# Patient Record
Sex: Male | Born: 1990 | Race: White | Hispanic: No | Marital: Single | Smoking: Current every day smoker
Health system: Southern US, Academic
[De-identification: ages and names within clinical notes are randomized; demographics above are authoritative.]

---

## 2016-06-12 ENCOUNTER — Emergency Department
Admission: EM | Admit: 2016-06-12 | Discharge: 2016-06-12 | Disposition: A | Discharge status: Home / Self Care | Payer: Self-pay | Attending: Emergency Medicine | Admitting: Emergency Medicine

## 2016-06-12 ENCOUNTER — Encounter: Payer: Self-pay | Admitting: Emergency Medicine

## 2016-06-12 ENCOUNTER — Emergency Department: Payer: Self-pay

## 2016-06-12 DIAGNOSIS — J301 Allergic rhinitis due to pollen: Secondary | ICD-10-CM | POA: Insufficient documentation

## 2016-06-12 DIAGNOSIS — J069 Acute upper respiratory infection, unspecified: Secondary | ICD-10-CM | POA: Insufficient documentation

## 2016-06-12 DIAGNOSIS — F17200 Nicotine dependence, unspecified, uncomplicated: Secondary | ICD-10-CM | POA: Insufficient documentation

## 2016-06-12 DIAGNOSIS — B9789 Other viral agents as the cause of diseases classified elsewhere: Secondary | ICD-10-CM

## 2016-06-12 MED ORDER — DEXAMETHASONE SODIUM PHOSPHATE 10 MG/ML IJ SOLN
10.0000 mg | Freq: Once | INTRAMUSCULAR | Status: AC
  Administered 2016-06-12: 10 mg via INTRAMUSCULAR
  Filled 2016-06-12: qty 1

## 2016-06-12 MED ORDER — PREDNISONE 10 MG PO TABS: 10.0000 mg | ORAL_TABLET | Freq: Two times a day (BID) | ORAL | 0 refills | Status: DC

## 2016-06-12 MED ORDER — FLUTICASONE PROPIONATE 50 MCG/ACT NA SUSP: 2.0000 | Freq: Every day | NASAL | 0 refills | Status: DC

## 2016-06-12 MED ORDER — BENZONATATE 100 MG PO CAPS: 100.0000 mg | ORAL_CAPSULE | Freq: Three times a day (TID) | ORAL | 0 refills | Status: DC | PRN

## 2016-06-12 MED ORDER — IPRATROPIUM-ALBUTEROL 0.5-2.5 (3) MG/3ML IN SOLN
3.0000 mL | Freq: Once | RESPIRATORY_TRACT | Status: AC
  Administered 2016-06-12: 3 mL via RESPIRATORY_TRACT
  Filled 2016-06-12: qty 3

## 2016-06-12 NOTE — ED Triage Notes (Signed)
Pt reports for 3 weeks been coughing productive cough, sputum green in color, reports today sinus pressure radiating to left ear. Pt talks in complete sentences no respiratory distress noted.

## 2016-06-12 NOTE — Discharge Instructions (Signed)
Your symptoms are likely viral in nature. Your chest x-ray was negative for any signs of pneumonia or bronchitis. You will, however, be treated for a viral bronchitis. Take the prescription meds, along with OTC Benadryl or Claritin/Zyrtec for nasal drainage. Consider starting pseudoephedrine, which is a decongestant. It will decrease your facial and ear pressure. Drink plenty of fluids and rest as much as possible.

## 2016-06-12 NOTE — ED Provider Notes (Signed)
[facility] Emergency Department Provider Note ____________________________________________  Time seen: 2043  I have reviewed the triage vital signs and the nursing notes.  HISTORY  Chief Complaint  Facial Pain and Cough  HPI Tim Collins is a 26 y.o. male presents to the ED for evaluation of a 3 to four-week complaint of intermittent cough, congestion, postnasal drainage, and ear pressure. Patient reports subjective fevers,left ear pressure and fullness, and bodyaches. He is dosed over-the-counter cough syrup, Chloraseptic, and Tylenol with limited benefit. He denies any recent travel or exposures. He does report similar symptoms in his coworkers, he works at [employer]. He is a smoker but denies any history of asthma, bronchitis, or seasonal allergies.  History reviewed. No pertinent past medical history.  There are no active problems to display for this patient.  History reviewed. No pertinent surgical history.  Prior to Admission medications   Medication Sig Start Date End Date Taking? Authorizing Provider  benzonatate (TESSALON PERLES) 100 MG capsule Take 1 capsule (100 mg total) by mouth 3 (three) times daily as needed for cough (Take 1-2 per dose). 06/12/16   Hyun Marsalis, Charlesetta IvoryJenise V Bacon, PA-C  fluticasone (FLONASE) 50 MCG/ACT nasal spray Place 2 sprays into both nostrils daily. 06/12/16   Delainy Mcelhiney, Charlesetta IvoryJenise V Bacon, PA-C  predniSONE (DELTASONE) 10 MG tablet Take 1 tablet (10 mg total) by mouth 2 (two) times daily with a meal. 06/12/16   Robin Pafford, Charlesetta IvoryJenise V Bacon, PA-C    Allergies Patient has no known allergies.  No family history on file.  Social History Social History  Substance Use Topics  . Smoking status: Current Every Day Smoker  . Smokeless tobacco: Current User  . Alcohol use No    Review of Systems  Constitutional: Positive for subjective fevers. Eyes: Negative for eye discharge. ENT: Positive for sore throat or nasal congestion, and postnasal  drainage. Cardiovascular: Negative for chest pain. Respiratory: Negative for shortness of breath. Gastrointestinal: Negative for abdominal pain, vomiting and diarrhea. Musculoskeletal: Negative for back pain. Skin: Negative for rash. Neurological: Negative for headaches, focal weakness or numbness. ____________________________________________  PHYSICAL EXAM:  VITAL SIGNS: ED Triage Vitals  Enc Vitals Group     BP 06/12/16 2005 125/70     Pulse Rate 06/12/16 2005 73     Resp 06/12/16 2005 20     Temp 06/12/16 2005 98 F (36.7 C)     Temp Source 06/12/16 2005 Oral     SpO2 06/12/16 2005 99 %     Weight 06/12/16 2005 150 lb (68 kg)     Height 06/12/16 2005 5\' 9"  (1.753 m)     Head Circumference --      Peak Flow --      Pain Score 06/12/16 2004 6     Pain Loc --      Pain Edu? --      Excl. in GC? --     Constitutional: Alert and oriented. Well appearing and in no distress. Head: Normocephalic and atraumatic. Eyes: Conjunctivae are normal. PERRL. Normal extraocular movements Ears: Canals clear. TMs intact bilaterally. Left TM is erythematous, retracted, and a serous effusion is noted. Nose: No congestion/rhinorrhea/epistaxis. Turbinates are enlarged, pale, and edematous. Mouth/Throat: Mucous membranes are moist. Uvula is midline and tonsils are enlarged but without erythema, exudate, or injection. Neck: Supple. No thyromegaly. Hematological/Lymphatic/Immunological: No cervical lymphadenopathy. Cardiovascular: Normal rate, regular rhythm. Normal distal pulses. Respiratory: Normal respiratory effort. No wheezes/rales/rhonchi. Skin:  Skin is warm, dry and intact. No rash noted. ___________________________________________  RADIOLOGY  CXR IMPRESSION: No active cardiopulmonary disease. ____________________________________________  PROCEDURES  DuoNeb x 1 Decadron 10 mg IM ____________________________________________  INITIAL IMPRESSION / ASSESSMENT AND PLAN / ED  COURSE  Patient with a presentation likely represents a viral URI with bronchitis. He has a right ear serous effusion and nasal congestion. He'll be discharged with a prescription for prednisone, Tessalon Perles, and Flonase. He'll follow-up with the local committee clinic or return to the ED for acutely worsening symptoms. ____________________________________________  FINAL CLINICAL IMPRESSION(S) / ED DIAGNOSES  Final diagnoses:  Viral URI with cough  Allergic rhinitis due to pollen, unspecified seasonality      Nalini Alcaraz, Charlesetta Ivory, PA-C 06/12/16 2139    Loleta Rose, MD 06/12/16 2200

## 2016-10-05 ENCOUNTER — Encounter: Payer: Self-pay | Admitting: Emergency Medicine

## 2016-10-05 ENCOUNTER — Emergency Department
Admission: EM | Admit: 2016-10-05 | Discharge: 2016-10-05 | Disposition: A | Discharge status: Home / Self Care | Payer: Self-pay | Attending: Emergency Medicine | Admitting: Emergency Medicine

## 2016-10-05 DIAGNOSIS — S025XXA Fracture of tooth (traumatic), initial encounter for closed fracture: Secondary | ICD-10-CM | POA: Insufficient documentation

## 2016-10-05 DIAGNOSIS — F17200 Nicotine dependence, unspecified, uncomplicated: Secondary | ICD-10-CM | POA: Insufficient documentation

## 2016-10-05 DIAGNOSIS — Y999 Unspecified external cause status: Secondary | ICD-10-CM | POA: Insufficient documentation

## 2016-10-05 DIAGNOSIS — Y929 Unspecified place or not applicable: Secondary | ICD-10-CM | POA: Insufficient documentation

## 2016-10-05 DIAGNOSIS — Y9389 Activity, other specified: Secondary | ICD-10-CM | POA: Insufficient documentation

## 2016-10-05 DIAGNOSIS — X58XXXA Exposure to other specified factors, initial encounter: Secondary | ICD-10-CM | POA: Insufficient documentation

## 2016-10-05 DIAGNOSIS — Z79899 Other long term (current) drug therapy: Secondary | ICD-10-CM | POA: Insufficient documentation

## 2016-10-05 MED ORDER — LIDOCAINE VISCOUS 2 % MT SOLN
15.0000 mL | Freq: Once | OROMUCOSAL | Status: AC
  Administered 2016-10-05: 15 mL via OROMUCOSAL
  Filled 2016-10-05: qty 15

## 2016-10-05 MED ORDER — TRAMADOL HCL 50 MG PO TABS: 50.0000 mg | ORAL_TABLET | Freq: Four times a day (QID) | ORAL | 0 refills | Status: DC | PRN

## 2016-10-05 MED ORDER — LIDOCAINE VISCOUS 2 % MT SOLN: 10.0000 mL | OROMUCOSAL | 0 refills | Status: DC | PRN

## 2016-10-05 MED ORDER — OXYCODONE-ACETAMINOPHEN 5-325 MG PO TABS
1.0000 | ORAL_TABLET | Freq: Once | ORAL | Status: AC
  Administered 2016-10-05: 1 via ORAL
  Filled 2016-10-05: qty 1

## 2016-10-05 MED ORDER — AMOXICILLIN 500 MG PO CAPS: 500.0000 mg | ORAL_CAPSULE | Freq: Three times a day (TID) | ORAL | 0 refills | Status: DC

## 2016-10-05 NOTE — ED Notes (Signed)
Pt was ambulatory without difficulty. Ice pack given. NAD, VSS. No concerns or needs voiced.

## 2016-10-05 NOTE — ED Triage Notes (Signed)
Pt reports right lower jaw pain for four days.

## 2016-10-05 NOTE — Discharge Instructions (Signed)
OPTIONS FOR DENTAL FOLLOW UP CARE ° °[place] Department of Health and Human Services - Local Safety Net Dental Clinics °http://www.ncdhhs.gov/dph/oralhealth/services/safetynetclinics.htm °  °[facility] (336-562-3123) ° °[facility] [place] ([phone]) ° °[facility] ([phone] ext [phone]) ° °[facility] ([phone]) ° °[facility] ([phone]) °This clinic caters to the indigent population and is on a lottery system. °Location: °[facility], [address], [place] °Clinic Hours: °Wednesdays from 6pm - 9pm, patients seen by a lottery system. °For dates, call or go to [email or web address].[facility].edu/[facility]/patients/Dental-[facility] °Services: °Cleanings, fillings and simple extractions. °Payment Options: °DENTAL WORK IS FREE OF CHARGE. Bring proof of income or support. °Best way to get seen: °Arrive at 5:15 pm - this is a lottery, NOT first come/first serve, so arriving earlier will not increase your chances of being seen. °  °  °[facility] °[phone] °Select option 1 for emergencies °  °Location: °[facility], [address], [place] °Clinic Hours: °No walk-ins accepted - call the day before to schedule an appointment. °Check in times are 9:30 am and 1:30 pm. °Services: °Simple extractions, temporary fillings, pulpectomy/pulp debridement, uncomplicated abscess drainage. °Payment Options: °PAYMENT IS DUE AT THE TIME OF SERVICE.  Fee is usually $100-200, additional surgical procedures (e.g. abscess drainage) may be extra. °Cash, checks, Visa/MasterCard accepted.  Can file Medicaid if patient is covered for dental - patient should call case worker to check. °No discount for [facility] patients. °Best way to get seen: °MUST call the day before and get onto the schedule. Can usually be seen the next 1-2 days. No walk-ins accepted. °  °  °[facility] °[phone] °   °Location: °[facility], [address], [place] °Clinic Hours: °M, W, Th, F 8am or 1:30pm, Tues 9a or 1:30 - first come/first served. °Services: °Simple extractions, temporary fillings, uncomplicated abscess drainage.  You do not need to be an [place] resident. °Payment Options: °PAYMENT IS DUE AT THE TIME OF SERVICE. °Dental insurance, otherwise sliding scale - bring proof of income or support. °Depending on income and treatment needed, cost is usually $50-200. °Best way to get seen: °Arrive early as it is first come/first served. °  °  °[facility] °[phone] °  °Location: °[address] °Clinic Hours: °Mon-Thu 8a-5p °Services: °Most basic dental services including extractions and fillings. °Payment Options: °PAYMENT IS DUE AT THE TIME OF SERVICE. °Sliding scale, up to 50% off - bring proof if income or support. °Medicaid with dental option accepted. °Best way to get seen: °Call to schedule an appointment, can usually be seen within 2 weeks OR they will try to see walk-ins - show up at 8a or 2p (you may have to wait). °  °  °[facility] °[phone] °[place] RESIDENTS ONLY °  °Location: °[facility], [address], [place], [place] [address] °Clinic Hours: By appointment only. °Monday - Thursday 8am-5pm, Friday 8am-12pm °Services: Cleanings, fillings, extractions. °Payment Options: °PAYMENT IS DUE AT THE TIME OF SERVICE. °Cash, Visa or MasterCard. Sliding scale - $30 minimum per service. °Best way to get seen: °Come in to office, complete packet and make an appointment - need proof of income °or support monies for each household member and proof of [place] residence. °Usually takes about a month to get in. °  °  °[facility] °[phone] °  °Location: °[address]   [place] °Clinic Hours: Walk-in Urgent Care Dental Services are offered Monday-Friday  mornings only. °The numbers of emergencies accepted daily is limited to the number of °providers available. °Maximum 15 - Mondays, Wednesdays & Thursdays °Maximum 10 - Tuesdays & Fridays °Services: °You do not need to be a  [place] resident to be seen for a dental emergency. °Emergencies are defined as pain, swelling, abnormal bleeding, or dental trauma. Walkins will receive x-rays if needed. °NOTE: Dental cleaning is not an emergency. °Payment Options: °PAYMENT IS DUE AT THE TIME OF SERVICE. °Minimum co-pay is $40.00 for uninsured patients. °Minimum co-pay is $3.00 for Medicaid with dental coverage. °Dental Insurance is accepted and must be presented at time of visit. °Medicare does not cover dental. °Forms of payment: Cash, credit card, checks. °Best way to get seen: °If not previously registered with the clinic, walk-in dental registration begins at 7:15 am and is on a first come/first serve basis. °If previously registered with the clinic, call to make an appointment. °  °  °[facility] °[phone] °[place] RESIDENTS ONLY °  °Location: °[address], [place], [place] °Clinic Hours: °Mon-Thu 10a-2p °Services: Extractions only! °Payment Options: °FREE (donations accepted) - bring proof of income or support °Best way to get seen: °Call and schedule an appointment OR come at 8am on the 1st Monday of every month (except for holidays) when it is first come/first served. °  °  °[facility] °[phone] °  °Location: °[address], [place] °Clinic Hours: °Friday mornings °Services, Payment Options, Best way to get seen: °Call for info °

## 2016-10-05 NOTE — ED Notes (Signed)
See triage note  Presents with right side tooth pain states he thinks he broke his wisdom tooth  Now having increased pain  No relief with OTC meds

## 2016-10-05 NOTE — ED Provider Notes (Signed)
[facility] Emergency Department Provider Note  ____________________________________________  Time seen: Approximately 2:43 PM  I have reviewed the triage vital signs and the nursing notes.   HISTORY  Chief Complaint Dental Pain    HPI Tim Collins is a 26 y.o. male presents to the emergency department for evaluation of right lower dental pain for 4 days after breaking his back tooth. He states that it is difficult to brush his teeth that far so he gets cavities. He is going to go to the [facility] dental clinic but they are not open until Wednesday. Patient is tried Orajel, Tylenol, Aleve, cold compresses. Cold compresses help the most.No fever, drainage from mouth, difficulty opening and closing mouth.   History reviewed. No pertinent past medical history.  There are no active problems to display for this patient.   No past surgical history on file.  Prior to Admission medications   Medication Sig Start Date End Date Taking? Authorizing Provider  amoxicillin (AMOXIL) 500 MG capsule Take 1 capsule (500 mg total) by mouth 3 (three) times daily. 10/05/16   Enid Derry, PA-C  benzonatate (TESSALON PERLES) 100 MG capsule Take 1 capsule (100 mg total) by mouth 3 (three) times daily as needed for cough (Take 1-2 per dose). 06/12/16   Menshew, Charlesetta Ivory, PA-C  fluticasone (FLONASE) 50 MCG/ACT nasal spray Place 2 sprays into both nostrils daily. 06/12/16   Menshew, Charlesetta Ivory, PA-C  lidocaine (XYLOCAINE) 2 % solution Use as directed 10 mLs in the mouth or throat as needed for mouth pain. 10/05/16   Enid Derry, PA-C  predniSONE (DELTASONE) 10 MG tablet Take 1 tablet (10 mg total) by mouth 2 (two) times daily with a meal. 06/12/16   Menshew, Charlesetta Ivory, PA-C  traMADol (ULTRAM) 50 MG tablet Take 1 tablet (50 mg total) by mouth every 6 (six) hours as needed. 10/05/16 10/05/17  Enid Derry, PA-C    Allergies Patient has no known allergies.  No family history  on file.  Social History Social History  Substance Use Topics  . Smoking status: Current Every Day Smoker  . Smokeless tobacco: Current User  . Alcohol use No     Review of Systems  Constitutional: No fever/chills Cardiovascular: No chest pain. Respiratory:  No SOB. Gastrointestinal: No abdominal pain.  No nausea, no vomiting.  Skin: Negative for rash, abrasions, lacerations, ecchymosis.   ____________________________________________   PHYSICAL EXAM:  VITAL SIGNS: ED Triage Vitals [10/05/16 1243]  Enc Vitals Group     BP 123/77     Pulse Rate (!) 58     Resp 18     Temp 98 F (36.7 C)     Temp Source Oral     SpO2 99 %     Weight 135 lb (61.2 kg)     Height  (1.753 m)     Head Circumference      Peak Flow      Pain Score 9     Pain Loc      Pain Edu?      Excl. in GC?      Constitutional: Alert and oriented. Well appearing and in no acute distress. Eyes: Conjunctivae are normal. PERRL. EOMI. Head: Atraumatic. ENT:      Ears:      Nose: No congestion/rhinnorhea.      Mouth/Throat: Mucous membranes are moist. Large fracture in back lower left molar with surrounding tenderness to palpation. No visible swelling. No drainage from mouth. No  TMJ pain. Neck: No stridor.  Cardiovascular: Normal rate, regular rhythm.  Good peripheral circulation. Respiratory: Normal respiratory effort without tachypnea or retractions. Lungs CTAB. Good air entry to the bases with no decreased or absent breath sounds. Musculoskeletal: Full range of motion to all extremities. No gross deformities appreciated. Neurologic:  Normal speech and language. No gross focal neurologic deficits are appreciated.  Skin:  Skin is warm, dry and intact. No rash noted.    ____________________________________________   LABS (all labs ordered are listed, but only abnormal results are displayed)  Labs Reviewed - No data to  display ____________________________________________  EKG   ____________________________________________  RADIOLOGY   No results found.  ____________________________________________    PROCEDURES  Procedure(s) performed:    Procedures    Medications  lidocaine (XYLOCAINE) 2 % viscous mouth solution 15 mL (15 mLs Mouth/Throat Given 10/05/16 1411)  oxyCODONE-acetaminophen (PERCOCET/ROXICET) 5-325 MG per tablet 1 tablet (1 tablet Oral Given 10/05/16 1411)     ____________________________________________   INITIAL IMPRESSION / ASSESSMENT AND PLAN / ED COURSE  Pertinent labs & imaging results that were available during my care of the patient were reviewed by me and considered in my medical decision making (see chart for details).  Review of the [place] CSRS was performed in accordance of the [facility] prior to dispensing any controlled drugs.   Patient's diagnosis is consistent with tooth fracture. Vital signs and exam are reassuring. Dental resources was provided. Patient will be discharged home with prescriptions for amoxicillin, viscous lidocaine, and a short course of tramadol. Patient is to follow up with a dentist as directed. Patient is given ED precautions to return to the ED for any worsening or new symptoms.     ____________________________________________  FINAL CLINICAL IMPRESSION(S) / ED DIAGNOSES  Final diagnoses:  Closed fracture of tooth, initial encounter      NEW MEDICATIONS STARTED DURING THIS VISIT:  Discharge Medication List as of 10/05/2016  2:37 PM    START taking these medications   Details  amoxicillin (AMOXIL) 500 MG capsule Take 1 capsule (500 mg total) by mouth 3 (three) times daily., Starting Thu 10/05/2016, Print    lidocaine (XYLOCAINE) 2 % solution Use as directed 10 mLs in the mouth or throat as needed for mouth pain., Starting Thu 10/05/2016, Print    traMADol (ULTRAM) 50 MG tablet Take 1 tablet (50 mg total) by mouth every 6 (six)  hours as needed., Starting Thu 10/05/2016, Until Fri 10/05/2017, Print            This chart was dictated using voice recognition software/Dragon. Despite best efforts to proofread, errors can occur which can change the meaning. Any change was purely unintentional.    Enid Derry, PA-C 10/05/16 1545    Governor Rooks, MD 10/06/16 0830

## 2017-08-02 ENCOUNTER — Emergency Department: Payer: Self-pay

## 2017-08-02 ENCOUNTER — Encounter: Payer: Self-pay | Admitting: Emergency Medicine

## 2017-08-02 ENCOUNTER — Emergency Department
Admission: EM | Admit: 2017-08-02 | Discharge: 2017-08-02 | Disposition: A | Discharge status: Home / Self Care | Payer: Self-pay | Attending: Emergency Medicine | Admitting: Emergency Medicine

## 2017-08-02 ENCOUNTER — Other Ambulatory Visit: Payer: Self-pay

## 2017-08-02 DIAGNOSIS — Y929 Unspecified place or not applicable: Secondary | ICD-10-CM | POA: Insufficient documentation

## 2017-08-02 DIAGNOSIS — Z23 Encounter for immunization: Secondary | ICD-10-CM | POA: Insufficient documentation

## 2017-08-02 DIAGNOSIS — S61213A Laceration without foreign body of left middle finger without damage to nail, initial encounter: Secondary | ICD-10-CM | POA: Insufficient documentation

## 2017-08-02 DIAGNOSIS — W298XXA Contact with other powered powered hand tools and household machinery, initial encounter: Secondary | ICD-10-CM | POA: Insufficient documentation

## 2017-08-02 DIAGNOSIS — S6982XA Other specified injuries of left wrist, hand and finger(s), initial encounter: Secondary | ICD-10-CM | POA: Insufficient documentation

## 2017-08-02 DIAGNOSIS — Y939 Activity, unspecified: Secondary | ICD-10-CM | POA: Insufficient documentation

## 2017-08-02 DIAGNOSIS — Y999 Unspecified external cause status: Secondary | ICD-10-CM | POA: Insufficient documentation

## 2017-08-02 MED ORDER — HYDROCODONE-ACETAMINOPHEN 5-325 MG PO TABS
1.0000 | ORAL_TABLET | Freq: Once | ORAL | Status: AC
  Administered 2017-08-02: 1 via ORAL
  Filled 2017-08-02: qty 1

## 2017-08-02 MED ORDER — TETANUS-DIPHTH-ACELL PERTUSSIS 5-2.5-18.5 LF-MCG/0.5 IM SUSP
0.5000 mL | Freq: Once | INTRAMUSCULAR | Status: AC
  Administered 2017-08-02: 0.5 mL via INTRAMUSCULAR
  Filled 2017-08-02: qty 0.5

## 2017-08-02 MED ORDER — LIDOCAINE HCL (PF) 1 % IJ SOLN
5.0000 mL | Freq: Once | INTRAMUSCULAR | Status: DC
  Filled 2017-08-02: qty 5

## 2017-08-02 NOTE — ED Triage Notes (Signed)
PT has LFT laceration noted to second and third digit from pressure washer machine. Bleeding controlled at this time, swelling noted. NAD noted VSS

## 2017-08-02 NOTE — Discharge Instructions (Signed)
You have a high-pressure water injury to the fingers. There is no bony injury. The air/water in the soft tissues will be absorbed by the body. Follow-up with Dr. Allena KatzPatel or a local urgent care for suture removal in 7-10 days. Return to the Emergency Department for worsening pain or disability. Keep the wounds clean & dry. Take OTC Tylenol and Motrin for pain, as needed.

## 2017-08-02 NOTE — ED Provider Notes (Signed)
[facility] Emergency Department Provider Note ____________________________________________  Time seen: 1505  I have reviewed the triage vital signs and the nursing notes.  HISTORY  Chief Complaint  Extremity Laceration  HPI Tim Collins is a 27 y.o. right-handed male presents to the ED for evaluation of left hand lacerations. He reports he accidentally cut the index and middle fingers, when the pressure washer slipped in his left, guide hand. He sustained lacerations to the base of the index & middle fingers. He reports normal gross sensation, but notes stiffness with ROM. He has no medical history and takes no daily medicines. He is unclear of his current tetanus status.   History reviewed. No pertinent past medical history.  There are no active problems to display for this patient.  History reviewed. No pertinent surgical history.  Prior to Admission medications   Not on File    Allergies Patient has no known allergies.  No family history on file.  Social History Social History   Tobacco Use  . Smoking status: Current Every Day Smoker  . Smokeless tobacco: Current User  Substance Use Topics  . Alcohol use: No  . Drug use: No    Review of Systems  Constitutional: Negative for fever. Cardiovascular: Negative for chest pain. Respiratory: Negative for shortness of breath. Musculoskeletal: Negative for back pain. Skin: Negative for rash. Left hand lacerations.  Neurological: Negative for headaches, focal weakness or numbness. ____________________________________________  PHYSICAL EXAM:  VITAL SIGNS: ED Triage Vitals  Enc Vitals Group     BP 08/02/17 1436 136/78     Pulse Rate 08/02/17 1436 69     Resp 08/02/17 1436 16     Temp 08/02/17 1436 97.6 F (36.4 C)     Temp Source 08/02/17 1436 Oral     SpO2 08/02/17 1436 100 %     Weight 08/02/17 1437 155 lb (70.3 kg)     Height --      Head Circumference --      Peak Flow --      Pain  Score 08/02/17 1436 0     Pain Loc --      Pain Edu? --      Excl. in GC? --     Constitutional: Alert and oriented. Well appearing and in no distress. Head: Normocephalic and atraumatic. Cardiovascular: Normal rate, regular rhythm. Normal distal pulses ad cap refill. Respiratory: Normal respiratory effort. No wheezes/rales/rhonchi. Musculoskeletal: Left hand with no obvious deformity or dislocation.  There is some soft tissue swelling noted to the proximal phalanx of the left middle finger.  2 superficial lacerations are appreciated to the base of the proximal phalanges of the index and middle finger.  No active bleeding is appreciated.  Patient is able to demonstrate a normal composite fist.  Nontender with normal range of motion in all extremities.  Neurologic:  Normal gross sensation. Normal intrinsic & opposition. Normal speech and language. No gross focal neurologic deficits are appreciated. Skin:  Skin is warm, dry and intact. No rash noted. ____________________________________________   RADIOLOGY  Left Hand  IMPRESSION: Soft tissue swelling and soft tissue air in the index and middle fingers with no underlying bony abnormality. ____________________________________________  PROCEDURES  Norco 5-325 mg PO Tdap 0.5 ml IM  .Marland KitchenLaceration Repair Date/Time: 08/02/2017 5:37 PM Performed by: Lissa Hoard, PA-C Authorized by: Lissa Hoard, PA-C   Consent:    Consent obtained:  Verbal   Consent given by:  Patient   Risks discussed:  Poor wound healing Anesthesia (see MAR for exact dosages):    Anesthesia method:  Nerve block   Block location:  Flexor tendon sheath index & middle fingers   Block needle gauge:  27 G   Block anesthetic:  Lidocaine 1% w/o epi   Block technique:  Transthecal block   Block injection procedure:  Anatomic landmarks palpated, introduced needle and incremental injection   Block outcome:  Anesthesia achieved Laceration details:     Location:  Finger   Finger location:  L long finger   Length (cm):  2 Repair type:    Repair type:  Simple Pre-procedure details:    Preparation:  Patient was prepped and draped in usual sterile fashion Treatment:    Area cleansed with:  Betadine   Amount of cleaning:  Standard Skin repair:    Repair method:  Sutures (+ wound adhesive to 2 cm superficial lac to L index finger middle phalanx)   Suture size:  4-0   Suture material:  Nylon   Suture technique:  Simple interrupted   Number of sutures:  3 Approximation:    Approximation:  Close Post-procedure details:    Dressing:  Non-adherent dressing   Patient tolerance of procedure:  Tolerated well, no immediate complications   Norco 5-325 mg PO ____________________________________________  INITIAL IMPRESSION / ASSESSMENT AND PLAN / ED COURSE  ----------------------------------------- 4:25 PM on 08/02/2017 ----------------------------------------- s/w S. Allena KatzPatel, MD (ortho). He agrees with suture repair and follow-up as needed.   Patient with ED evaluation of an accidental traumatic injury to the left hand.  He sustained a high pressure water injection to the left index and middle fingers.  He also sustained superficial lacerations to the same.  Patient's x-rays negative for any acute bony injury.  It does reveal subcutaneous air consistent with the mechanism.  Patient's exam is overall benign.  The index finger is repaired with wound adhesive and the middle finger is repaired with sutures.  Wound care instructions were provided to the patient.  He will follow-up with Dr. Allena KatzPatel of the local urgent care for wound check and suture removal in 7 to 10 days.  Return precautions have been reviewed.  Patient and his wife verbalized understanding.  He is released to left hand use as tolerated.  Patient works as a Merchant navy officerchef/Baker, and will keep the wound and dressing clean and dry. ____________________________________________  FINAL CLINICAL  IMPRESSION(S) / ED DIAGNOSES  Final diagnoses:  High-pressure injection injury of finger of left hand, initial encounter  Laceration of left middle finger without foreign body without damage to nail, initial encounter     Lissa HoardMenshew, Tristian Sickinger V Bacon, PA-C 08/02/17 1744    Emily FilbertWilliams, Jonathan E, MD 08/02/17 2136

## 2017-08-02 NOTE — ED Notes (Signed)
Dressing applied to left hand 

## 2017-08-02 NOTE — ED Notes (Signed)
See triage note  States he was using a pressure washer this afternoon  States it slipped  Laceration noted to left index and middle fingers

## 2018-03-13 IMAGING — CR DG CHEST 2V
2 series · 2 of 2 positions shown · non-contrast
Comparison: None.

CLINICAL DATA: Cough and fever x3 weeks

EXAM:
CHEST  2 VIEW

[chest pa]
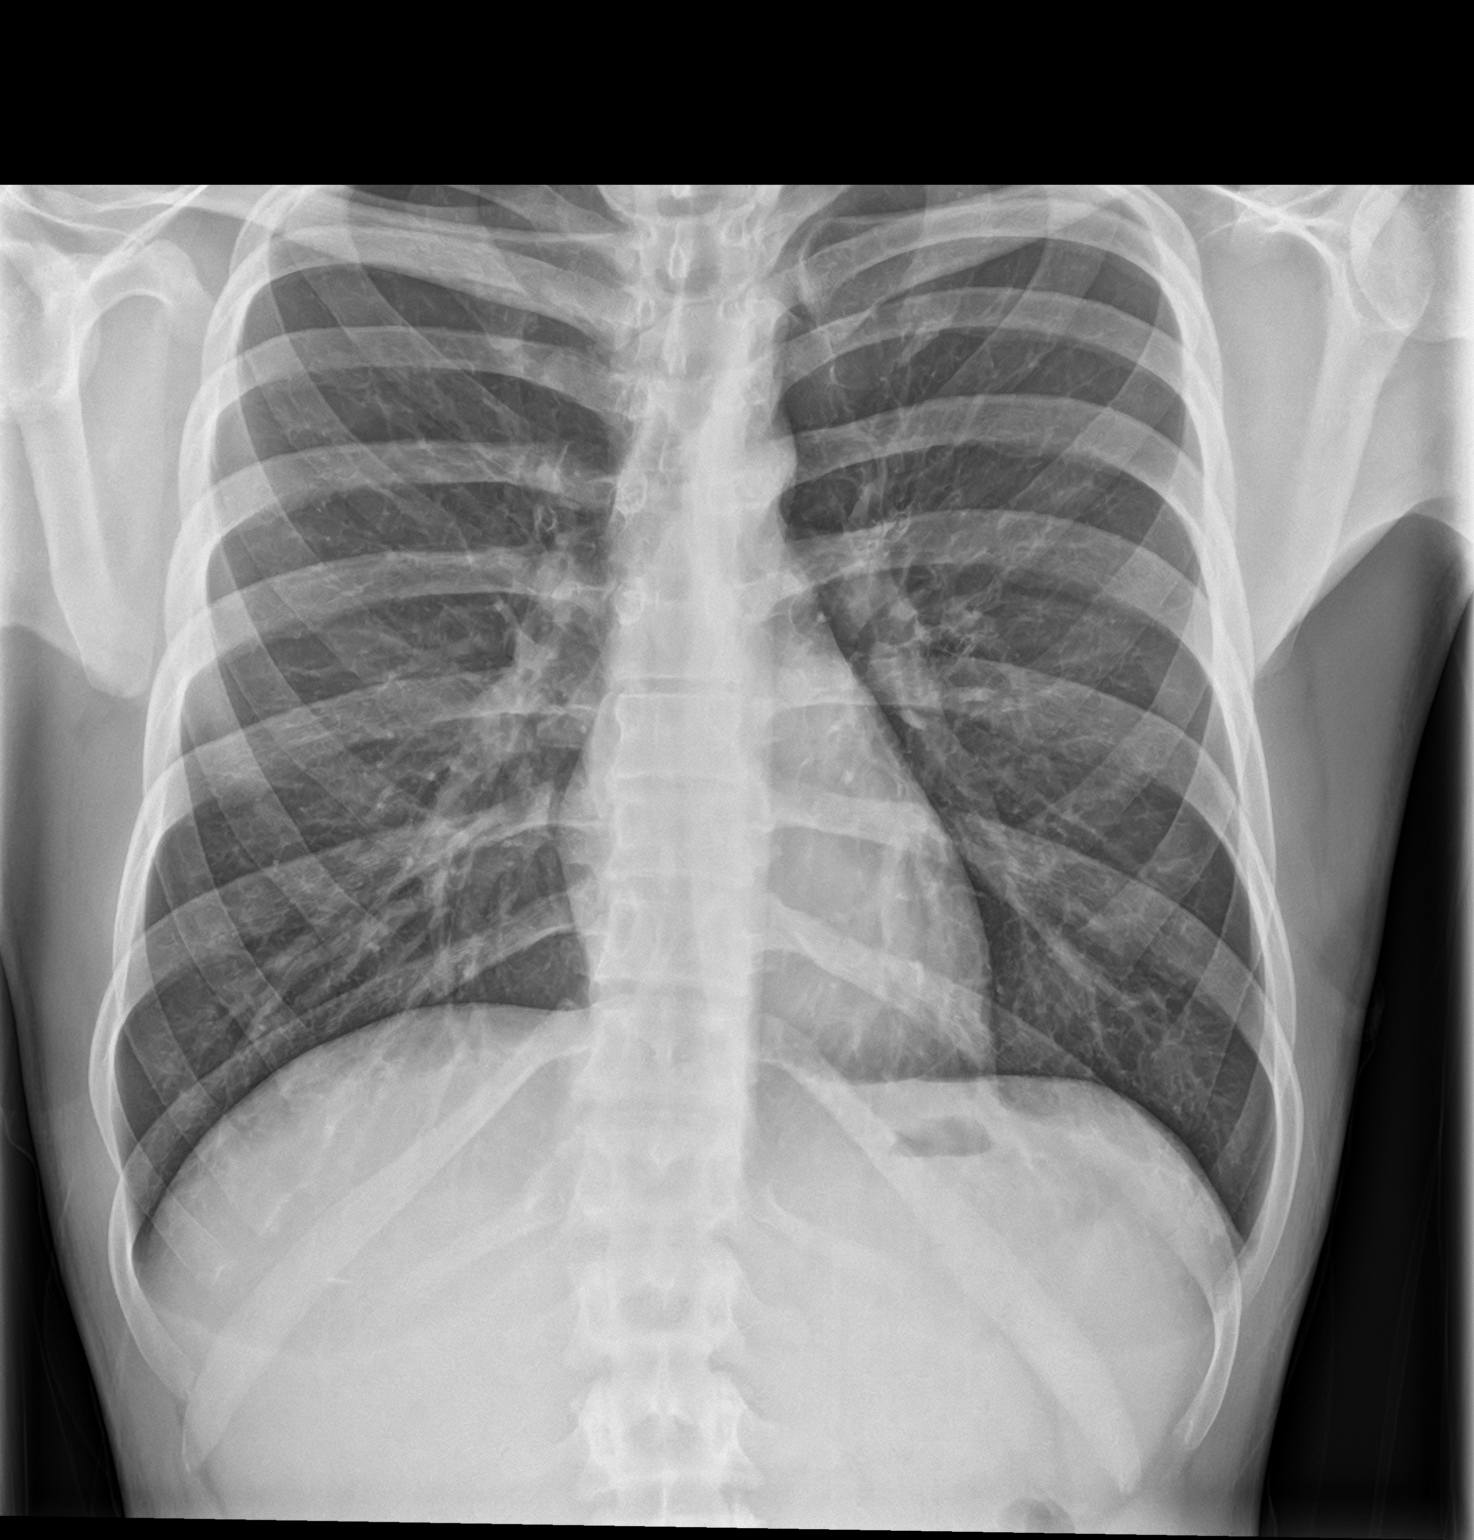

[chest lat]
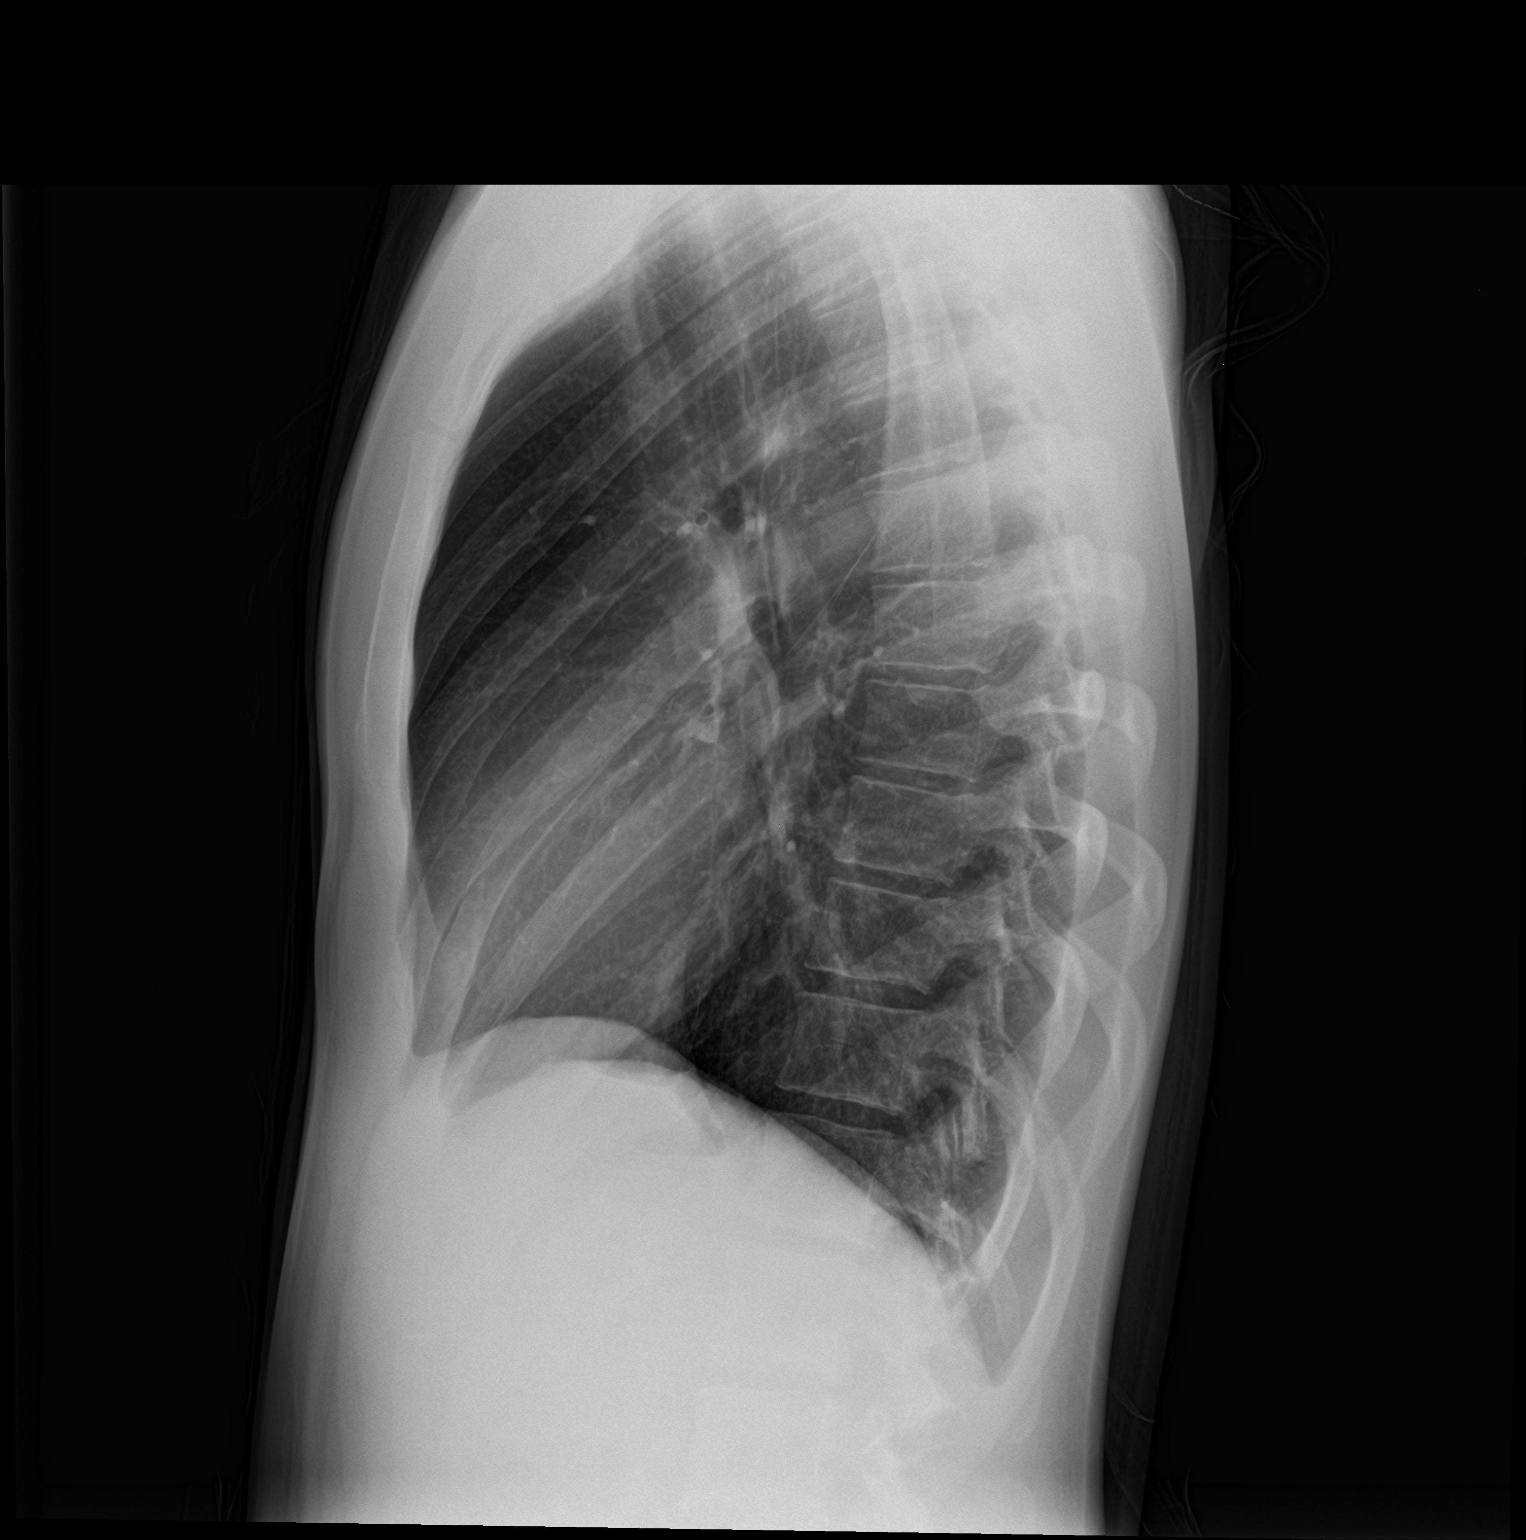

[2 of 2 positions shown; findings below may reference images not displayed]

FINDINGS: The heart size and mediastinal contours are within normal limits.
Both lungs are clear. The visualized skeletal structures are
unremarkable.
IMPRESSION: No active cardiopulmonary disease.

## 2019-05-03 IMAGING — DX DG HAND COMPLETE 3+V*L*
3 series · 3 of 3 positions shown · non-contrast
Comparison: None.

CLINICAL DATA: Pressure washer lacerations to the bases of the
index and middle fingers.

EXAM:
LEFT HAND - COMPLETE 3+ VIEW

[hand ap]
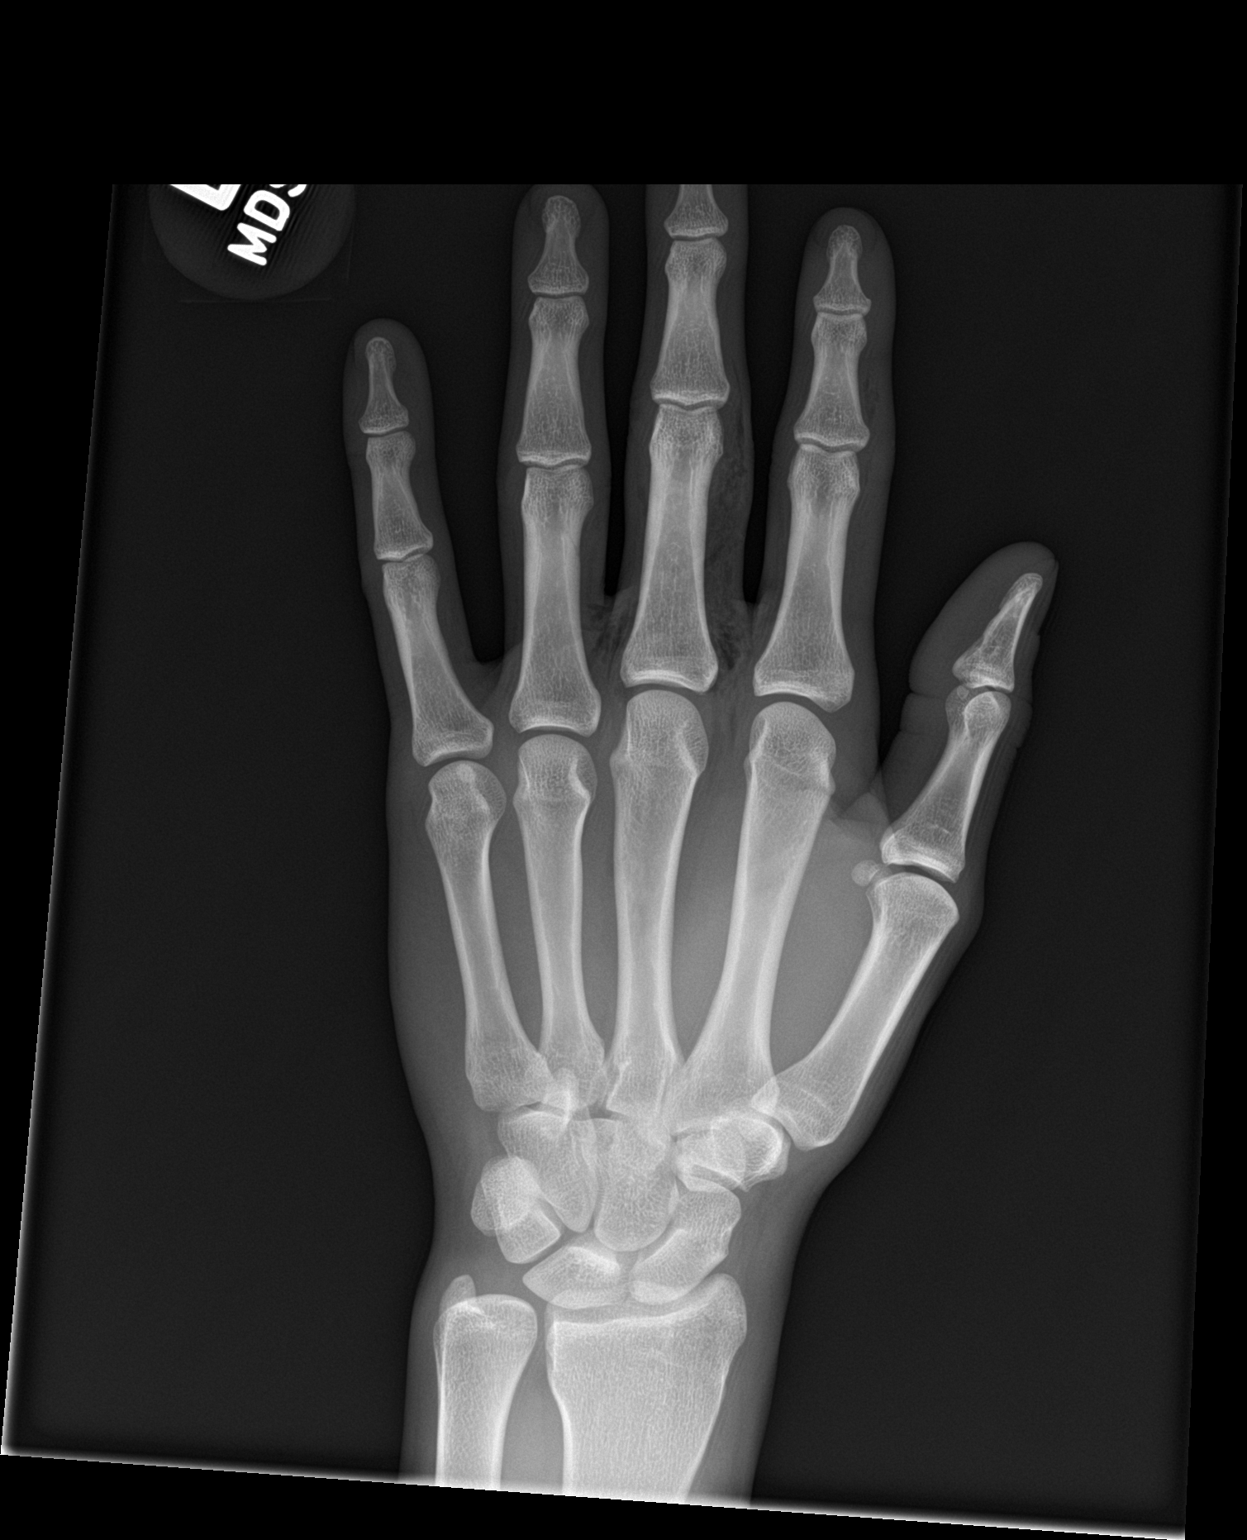

[hand obl]
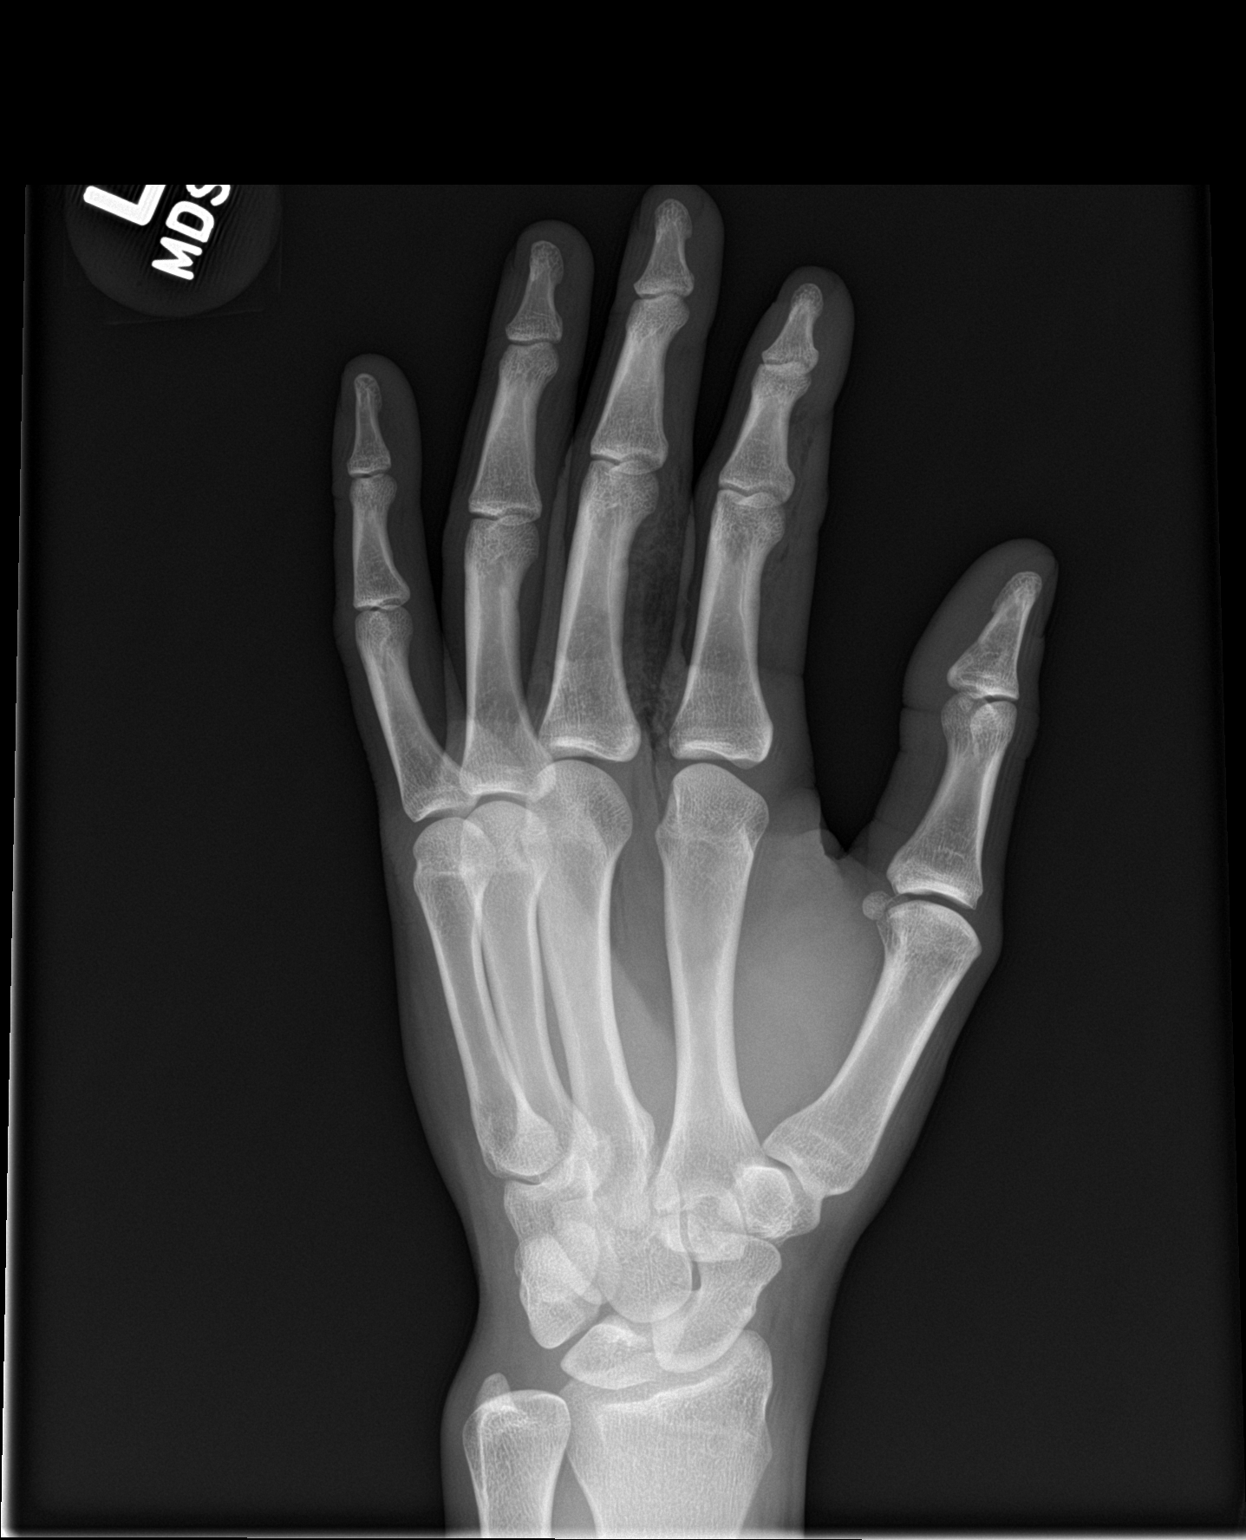

[hand lat]
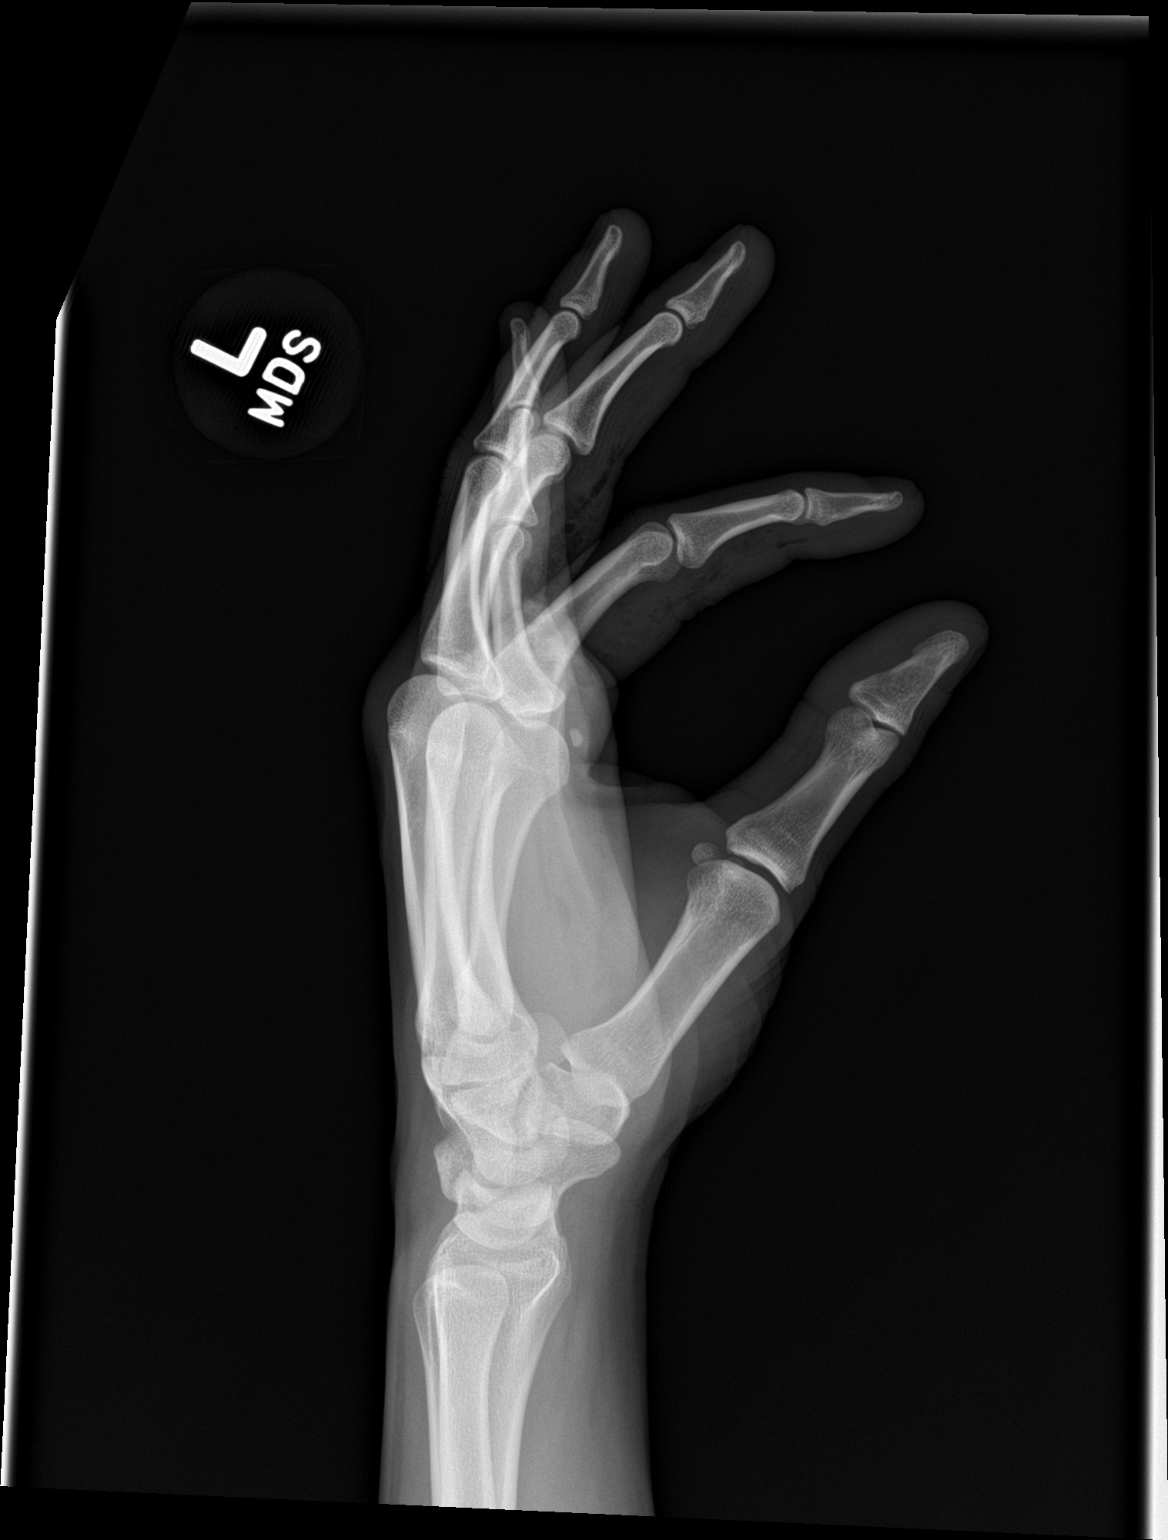

[3 of 3 positions shown; findings below may reference images not displayed]

FINDINGS: Soft tissue swelling and soft tissue air in the index and middle
fingers. No fracture, dislocation or radiopaque foreign body.
IMPRESSION: Soft tissue swelling and soft tissue air in the index and middle
fingers with no underlying bony abnormality.

## 2019-06-16 ENCOUNTER — Encounter (FREE_STANDING_LABORATORY_FACILITY)
Admit: 2019-06-16 | Discharge: 2019-06-16 | Disposition: A | Discharge status: Home / Self Care | Payer: Self-pay | Attending: PSYCHIATRY & NEUROLOGY, PSYCHIATRY | Admitting: PSYCHIATRY & NEUROLOGY, PSYCHIATRY

## 2019-06-16 ENCOUNTER — Encounter (FREE_STANDING_LABORATORY_FACILITY): Payer: Self-pay | Admitting: PSYCHIATRY & NEUROLOGY, PSYCHIATRY

## 2019-06-16 DIAGNOSIS — Z79899 Other long term (current) drug therapy: Secondary | ICD-10-CM | POA: Insufficient documentation

## 2019-06-16 LAB — CBC WITH DIFF
BASOPHIL #: <0.1 10*3/uL (ref <=0.20)
BASOPHIL %: 1 %
EOSINOPHIL #: 0.28 10*3/uL (ref <=0.50)
EOSINOPHIL %: 4 %
HCT: 43.8 % (ref 38.9–52.0)
HGB: 15.3 g/dL (ref 13.4–17.5)
IMMATURE GRANULOCYTE #: <0.1 10*3/uL (ref <0.10)
IMMATURE GRANULOCYTE %: 1 % (ref 0–1)
LYMPHOCYTE #: 2.64 10*3/uL (ref 1.00–4.80)
LYMPHOCYTE %: 37 %
MCH: 29.8 pg (ref 26.0–32.0)
MCHC: 34.9 g/dL (ref 31.0–35.5)
MCV: 85.4 fL (ref 78.0–100.0)
MONOCYTE #: 0.52 10*3/uL (ref 0.20–1.10)
MONOCYTE %: 7 %
MPV: 10.7 fL (ref 8.7–12.5)
NEUTROPHIL #: 3.58 10*3/uL (ref 1.50–7.70)
NEUTROPHIL %: 50 %
PLATELETS: 266 10*3/uL (ref 150–400)
RBC: 5.13 10*6/uL (ref 4.50–6.10)
RDW-CV: 13 % (ref 11.5–15.5)
WBC: 7.1 10*3/uL (ref 3.7–11.0)

## 2019-06-16 LAB — CBC/DIFF - CLIENT CONSOLIDATED
BASOPHIL #: <0.1 10*3/uL (ref <=0.20)
BASOPHIL %: 1 %
EOSINOPHIL #: 0.28 10*3/uL (ref <=0.50)
EOSINOPHIL %: 4 %
HCT: 43.8 % (ref 38.9–52.0)
HGB: 15.3 g/dL (ref 13.4–17.5)
IMMATURE GRANULOCYTE #: <0.1 10*3/uL (ref <0.10)
IMMATURE GRANULOCYTE %: 1 % (ref 0–1)
LYMPHOCYTE #: 2.64 10*3/uL (ref 1.00–4.80)
LYMPHOCYTE %: 37 %
MCH: 29.8 pg (ref 26.0–32.0)
MCHC: 34.9 g/dL (ref 31.0–35.5)
MCV: 85.4 fL (ref 78.0–100.0)
MONOCYTE #: 0.52 10*3/uL (ref 0.20–1.10)
MONOCYTE %: 7 %
MPV: 10.7 fL (ref 8.7–12.5)
NEUTROPHIL #: 3.58 10*3/uL (ref 1.50–7.70)
NEUTROPHIL %: 50 %
PLATELETS: 266 10*3/uL (ref 150–400)
RBC: 5.13 10*6/uL (ref 4.50–6.10)
RDW-CV: 13 % (ref 11.5–15.5)
WBC: 7.1 10*3/uL (ref 3.7–11.0)

## 2019-06-16 LAB — BASIC METABOLIC PANEL
ANION GAP: 8 mmol/L (ref 4–13)
BUN/CREA RATIO: 12 (ref 6–22)
BUN: 9 mg/dL (ref 8–25)
CALCIUM: 9 mg/dL (ref 8.5–10.0)
CHLORIDE: 103 mmol/L (ref 96–111)
CO2 TOTAL: 25 mmol/L (ref 22–30)
CREATININE: 0.78 mg/dL (ref 0.75–1.35)
GLUCOSE: 82 mg/dL (ref 65–125)
POTASSIUM: 3.6 mmol/L (ref 3.5–5.1)
SODIUM: 136 mmol/L (ref 136–145)

## 2019-06-16 LAB — HEPATIC FUNCTION PANEL
ALBUMIN: 4.2 g/dL (ref 3.5–5.0)
ALKALINE PHOSPHATASE: 73 U/L (ref 45–115)
ALT (SGPT): 18 U/L (ref 10–55)
AST (SGOT): 20 U/L (ref 8–45)
BILIRUBIN DIRECT: 0.3 mg/dL (ref 0.1–0.4)
BILIRUBIN TOTAL: 1 mg/dL (ref 0.3–1.3)
PROTEIN TOTAL: 7 g/dL (ref 6.4–8.3)

## 2019-06-16 LAB — LIPID PANEL
CHOL/HDL RATIO: 4.2
CHOLESTEROL: 150 mg/dL (ref 100–200)
HDL CHOL: 36 mg/dL — ABNORMAL LOW (ref >=50)
LDL CALC: 94 mg/dL (ref <100)
NON-HDL: 114 mg/dL (ref <=190)
TRIGLYCERIDES: 101 mg/dL (ref <150)
VLDL CALC: 20 mg/dL (ref <30)

## 2019-06-16 LAB — THYROID STIMULATING HORMONE WITH FREE T4 REFLEX: TSH: 1.452 u[IU]/mL (ref 0.430–3.550)

## 2022-02-23 ENCOUNTER — Inpatient Hospital Stay
Admission: EM | Admit: 2022-02-23 | Discharge: 2022-02-27 | DRG: 123 | Disposition: A | Discharge status: Home / Self Care | Payer: Self-pay | Attending: Neurology | Admitting: Neurology

## 2022-02-23 ENCOUNTER — Emergency Department (HOSPITAL_BASED_OUTPATIENT_CLINIC_OR_DEPARTMENT_OTHER): Payer: Self-pay

## 2022-02-23 ENCOUNTER — Inpatient Hospital Stay (HOSPITAL_COMMUNITY): Payer: Self-pay | Admitting: Neurology

## 2022-02-23 ENCOUNTER — Ambulatory Visit (INDEPENDENT_AMBULATORY_CARE_PROVIDER_SITE_OTHER): Payer: Self-pay

## 2022-02-23 ENCOUNTER — Other Ambulatory Visit: Payer: Self-pay

## 2022-02-23 DIAGNOSIS — H469 Unspecified optic neuritis: Principal | ICD-10-CM | POA: Diagnosis present

## 2022-02-23 DIAGNOSIS — H539 Unspecified visual disturbance: Secondary | ICD-10-CM

## 2022-02-23 LAB — CBC WITH DIFF
BASOPHIL #: <0.1 10*3/uL (ref <=0.20)
BASOPHIL %: 0.6 %
EOSINOPHIL #: <0.1 10*3/uL (ref <=0.50)
EOSINOPHIL %: 0.8 %
HCT: 43 % (ref 38.9–52.0)
HGB: 15.1 g/dL (ref 13.4–17.5)
IMMATURE GRANULOCYTE #: <0.1 10*3/uL (ref <0.10)
IMMATURE GRANULOCYTE %: 0.3 % (ref 0.0–1.0)
LYMPHOCYTE #: 2.75 10*3/uL (ref 1.00–4.80)
LYMPHOCYTE %: 31.4 %
MCH: 30.3 pg (ref 26.0–32.0)
MCHC: 35.1 g/dL (ref 31.0–35.5)
MCV: 86.3 fL (ref 78.0–100.0)
MONOCYTE #: 0.39 10*3/uL (ref 0.20–1.10)
MONOCYTE %: 4.4 %
MPV: 9.9 fL (ref 8.7–12.5)
NEUTROPHIL #: 5.48 10*3/uL (ref 1.50–7.70)
NEUTROPHIL %: 62.5 %
PLATELETS: 255 10*3/uL (ref 150–400)
RBC: 4.98 10*6/uL (ref 4.50–6.10)
RDW-CV: 12.3 % (ref 11.5–15.5)
WBC: 8.8 10*3/uL (ref 3.7–11.0)

## 2022-02-23 LAB — BASIC METABOLIC PANEL
ANION GAP: 5 mmol/L (ref 4–13)
BUN/CREA RATIO: 18 (ref 6–22)
BUN: 17 mg/dL (ref 8–25)
CALCIUM: 9 mg/dL (ref 8.6–10.2)
CHLORIDE: 107 mmol/L (ref 96–111)
CO2 TOTAL: 28 mmol/L (ref 22–30)
CREATININE: 0.92 mg/dL (ref 0.75–1.35)
ESTIMATED GFR - MALE: >90 mL/min/BSA (ref >=60)
GLUCOSE: 75 mg/dL (ref 65–125)
POTASSIUM: 4 mmol/L (ref 3.5–5.1)
SODIUM: 140 mmol/L (ref 136–145)

## 2022-02-23 LAB — HEPATIC FUNCTION PANEL
ALBUMIN: 4.3 g/dL (ref 3.5–5.0)
ALKALINE PHOSPHATASE: 65 U/L (ref 45–115)
ALT (SGPT): 26 U/L (ref 10–55)
AST (SGOT): 19 U/L (ref 8–45)
BILIRUBIN DIRECT: 0.1 mg/dL (ref 0.1–0.4)
BILIRUBIN TOTAL: 0.4 mg/dL (ref 0.3–1.3)
PROTEIN TOTAL: 7.2 g/dL (ref 6.4–8.3)

## 2022-02-23 LAB — PTT (PARTIAL THROMBOPLASTIN TIME): APTT: 34.2 seconds (ref 24.2–37.5)

## 2022-02-23 LAB — C-REACTIVE PROTEIN(CRP),INFLAMMATION: CRP INFLAMMATION: 0.9 mg/L (ref <8.0)

## 2022-02-23 LAB — SYPHILIS SCREENING ALGORITHM WITH REFLEX, SERUM: SYPHILIS TP ANTIBODIES: NONREACTIVE

## 2022-02-23 LAB — SEDIMENTATION RATE: ERYTHROCYTE SEDIMENTATION RATE (ESR): 10 mm/hr (ref 0–15)

## 2022-02-23 LAB — PT/INR
INR: 1.03 (ref 0.80–1.20)
PROTHROMBIN TIME: 12.6 seconds (ref 9.8–14.4)

## 2022-02-23 MED ORDER — GADOPICLENOL 0.5 MMOL/ML INTRAVENOUS SOLUTION
7.0000 mL | INTRAVENOUS | Status: AC
Start: 2022-02-23 — End: 2022-02-23
  Administered 2022-02-23: 7 mL via INTRAVENOUS

## 2022-02-23 NOTE — Telephone Encounter (Signed)
Dr. Garen Lah office does not open until 10:00 AM. Called both numbers lists for patient. Call went to voicemail.Message left with call back number. Garey Ham, RN

## 2022-02-23 NOTE — Telephone Encounter (Signed)
-----   Message from [place] sent at 02/23/2022  9:49 AM EST -----    Gordon Hobbs from Dr. Williams Che office calling to schedule apt for patient,  advised nurse call patient on 2 phone#,  updated cellphone From [place],  call Alisha to schedule @ [place]: [phone],   Thank You!   ----- Message from Myrtha Mantis sent at 02/22/2022  3:27 PM EST -----  Dr. Audie Box ofc calling back the phone # to reach them is [phone]    ----- Message from Namon Cirri sent at 02/22/2022  3:03 PM EST -----  Dr. Williams Che wanting the patient seen ASAP or optic neuritis with vision loss and pain. [phone]

## 2022-02-23 NOTE — Telephone Encounter (Signed)
-----   Message from Myrtha Mantis sent at 02/22/2022  3:27 PM EST -----  Dr. Audie Box ofc calling back the phone # to reach them is [phone]    ----- Message from Namon Cirri sent at 02/22/2022  3:03 PM EST -----  Dr. Williams Che wanting the patient seen ASAP or optic neuritis with vision loss and pain. [phone]

## 2022-02-23 NOTE — ED Resident Handoff Note (Signed)
Emergency Department  Resident Course Note    Patient Name: Gordon Hobbs  Age and Gender: 32 y.o. male  Date of Birth: Jun 08, 1990  Date of Service: 02/23/2022  PCP: No Pcp  Attending: Russ Halo, MD    After a thorough discussion of the patient including presentation, ED course, and review of above information I have assumed care of Gordon Hobbs from Dr. Aggie Hacker at 23:35 on 02/23/2022.    Triage Summary:   Eye Problem (Has been having vision issues in his right eye x 12 days. Was evaluated by opthalmology and was told he may be having optic nerve issues. )      HPI / ED course:  In brief, patient is a 32 y.o. 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      After discussing patient's HPI, ED course and plan, patient was signed out and care transferred to Dr. ***, at [facility] on 02/23/2022***. Please see their Resident Course Note for further patient care information.    Following the above history, physical exam, and studies, the patient was deemed stable and suitable for discharge and he will follow up their appropriate medical providers as below.  Medication instructions were discussed with the patient/patient's family. Patient/patient's family was advised to return to the ED with any new, concerning or worsening symptoms and follow up as directed. The patient/patient's family verbalized understanding of all instructions and had no further questions or concerns.    Follow up:   No follow-up provider specified.    Pertinent Imaging/Lab results:  Labs Ordered/Reviewed   BASIC METABOLIC PANEL - Normal   C-REACTIVE  PROTEIN(CRP),INFLAMMATION - Normal   SEDIMENTATION RATE - Normal   HEPATIC FUNCTION PANEL - Normal   PT/INR - Normal    Narrative:     Coumadin therapy INR range for Conventional Anticoagulation is 2.0 to 3.0 and for Intensive Anticoagulation 2.5 to 3.5.   PTT (PARTIAL THROMBOPLASTIN TIME) - Normal    Narrative:     Therapeutic range for unfractionated heparin is 60-100 seconds.   SYPHILIS SCREENING ALGORITHM WITH REFLEX, SERUM - Normal   CBC/DIFF    Narrative:     The following orders were created for panel order CBC/DIFF.  Procedure                               Abnormality         Status                     ---------                               -----------         ------                     CBC WITH [id number]  Final result                 Please view results for these tests on the individual orders.   CBC WITH DIFF   ANA SCREEN BY IFA WITH REFLEX TO TITER/PATTERN AND SEROLOGY CASCADE, SERUM   LYME ANTIBODY PANEL WITH REFLEX   BARTONELLA SPECIES AB(IGG,IGM) W/REFL TITER     Results for orders placed or performed during the hospital encounter of 02/23/22   MRI BRAIN W/WO CONTRAST     Status: None    Narrative    [name]  Male, 32 years old.    MRI BRAIN W/WO CONTRAST, MRI ORBITS W/WO CONTRAST performed on 02/23/2022 10:58 PM.    REASON FOR EXAM:  R sided visual changes      INTRAVENOUS CONTRAST: 7 ml (accession [id number]), 7 (accession [id number]) ml's of Vueway    TECHNIQUE: MRI brain and orbits are performed with and without IV contrast.    COMPARISON: None.    FINDINGS:     No acute intracranial hemorrhage, mass, mass effect, extra fluid collection. Ventricles are nondilated. No enhancing masses. Midline structures are normal. Paranasal sinuses and mastoid air cells are predominantly clear.    Focal short segment enhancement of the right optic nerve at the orbital apex with associated T2 signal abnormality. Optic globe and orbital musculature are  normal.      Impression    1.  Findings suggestive of right optic neuritis.  2. Normal brain MRI.   MRI ORBITS W/WO CONTRAST     Status: None    Narrative    [name]  Male, 32 years old.    MRI BRAIN W/WO CONTRAST, MRI ORBITS W/WO CONTRAST performed on 02/23/2022 10:58 PM.    REASON FOR EXAM:  R sided visual changes      INTRAVENOUS CONTRAST: 7 ml (accession [id number]), 7 (accession [id number]) ml's of Vueway    TECHNIQUE: MRI brain and orbits are performed with and without IV contrast.    COMPARISON: None.    FINDINGS:     No acute intracranial hemorrhage, mass, mass effect, extra fluid collection. Ventricles are nondilated. No enhancing masses. Midline structures are normal. Paranasal sinuses and mastoid air cells are predominantly clear.    Focal short segment enhancement of the right optic nerve at the orbital apex with associated T2 signal abnormality. Optic globe and orbital musculature are normal.      Impression    1.  Findings suggestive of right optic neuritis.  2. Normal brain MRI.   MRV INTRACRANIAL W/WO CONTRAST     Status: None    Narrative    [name]  Male, 32 years old.    MRV INTRACRANIAL W/WO CONTRAST performed on 02/23/2022 11:07 PM.    REASON FOR EXAM:  R sided vision changes      INTRAVENOUS CONTRAST: 7 ml's of Vueway    TECHNIQUE: MR venogram of the brain was performed with and without IV contrast.    COMPARISON: None.    FINDINGS:     Patent dural venous sinuses and deep cerebral veins.      Impression     As above.       Melven Sartorius, MD 02/23/2022, 23:35/  PGY-2 Emergency Medicine  [facility]  Pager # - Horatio Mobile    *Parts of this patients chart were completed in a retrospective fashion due to simultaneous direct patient care activities in the Emergency Department.   *This  note was partially generated using MModal Fluency Direct system, and there may be some incorrect words, spellings, and punctuation that were not noted in  checking the note before saving.

## 2022-02-23 NOTE — ED Attending Handoff Note (Signed)
Care assumed from Dr. Aggie Hacker    Patient is an 32 y.o. male here for right-sided optic nerve swelling. Has optic neuritis on MRI. Calling neuro for admission.     Neuro admitting

## 2022-02-23 NOTE — ED Provider Notes (Signed)
[place] Emergency Department    CC:  Eye problem    HPI:  Gordon Hobbs is a 32 y.o. male who presents with eye problem. For the past 10-12 days, the patient reports inability to see out of his right eye. He associates "pressure" around his right eye socket and above his right cheek bone. The patient notes pain with EOM. The patient indicates he was referred to the ED by Optho to r/o optic neuritis. The patient denies numbness, tingling, and weakness. No significant PMH or PSH indicated by patient or chart.    ROS:  Negative unless otherwise noted in HPI.     Medications:  Medications Prior to Admission       None            Allergies:  No Known Allergies  Allergies reviewed with patient    No past medical history on file.  PMH reviewed and negative.      History reviewed with patient.    PSH reviewed and negative.      Social History     Socioeconomic History    Marital status: Single     Spouse name: Not on file    Number of children: Not on file    Years of education: Not on file    Highest education level: Not on file   Occupational History    Not on file   Tobacco Use    Smoking status: Not on file    Smokeless tobacco: Not on file   Substance and Sexual Activity    Alcohol use: Not on file    Drug use: Not on file    Sexual activity: Not on file   Other Topics Concern    Not on file   Social History Narrative    Not on file     Social Determinants of Health     Financial Resource Strain: Not on file   Transportation Needs: Not on file   Social Connections: Not on file   Intimate Partner Violence: Not on file   Housing Stability: Not on file       Family Medical History:    None           Physical Exam:  All nurse's notes reviewed.  ED Triage Vitals [02/23/22 1321]   BP (Non-Invasive) (!) 126/90   Heart Rate 87   Respiratory Rate 18   Temperature 36.2 C (97.2 F)   SpO2 96 %   Weight 73.5 kg (162 lb 0.6 oz)   Height 1.753 m ('5\' 9"'$ )     Constitutional: NAD.   HENT:   Head: Normocephalic and  atraumatic.  Eyes: Eyes are dilated. EOMI, conjunctivae without discharge bilaterally  Neck: Trachea midline.   Cardiovascular: Regular rate.  Pulmonary/Chest: No respiratory distress. Breathing is unlabored.  Musculoskeletal: No gross deformity.  Psychiatric: normal mood and affect. Behavior is normal.  Neurological: Alert&Ox3. Grossly intact.       Labs/Imaging:  Labs Ordered/Reviewed   BASIC METABOLIC PANEL - Normal   C-REACTIVE PROTEIN(CRP),INFLAMMATION - Normal   SEDIMENTATION RATE - Normal   HEPATIC FUNCTION PANEL - Normal   CBC/DIFF    Narrative:     The following orders were created for panel order CBC/DIFF.  Procedure                               Abnormality         Status                     ---------                               -----------         ------  CBC WITH [id number]                                    Final result                 Please view results for these tests on the individual orders.   CBC WITH DIFF   PT/INR   PTT (PARTIAL THROMBOPLASTIN TIME)   ANA SCREEN BY IFA WITH REFLEX TO TITER/PATTERN AND SEROLOGY CASCADE, SERUM   SYPHILIS SCREENING ALGORITHM WITH REFLEX, SERUM   LYME ANTIBODY PANEL WITH REFLEX   BARTONELLA SPECIES AB(IGG,IGM) W/REFL TITER       No orders to display           Medical Decision Making  Amount and/or Complexity of Data Reviewed  Labs: ordered.  Radiology: ordered.      Impression:   Clinical Impression   None     Disposition:    Patient will be admitted for further evaluation and management. ***    After discussing patient's HPI, ED course and plan, patient was signed out and care transferred. ***      I am scribing for, and in the presence of, Katy Fitch, MD, for services provided on 02/23/2022  Judd Gaudier, SCRIBE    // Judd Gaudier, SCRIBE  02/23/2022, 21:06    This note was partially generated using MModal Fluency Direct system, and there may be some incorrect words, spellings, and punctuation that were not noted in checking  the note before saving.  Veva Holes, MD 02/23/2022, 22:48  [place] [facility] Department of Emergency Medicine

## 2022-02-23 NOTE — Ancillary Notes (Signed)
[place]  MRI Technologist Note        MRI has been completed.        Elvina Mattes, RT (R)(MR) 02/23/2022, 23:06

## 2022-02-23 NOTE — Telephone Encounter (Signed)
Spoke to Gordon Hobbs at referring office. Pt being referred for optic neuritis with vision loss and pain. Advised that pt needs to be seen in [facility] ER ASAP as he will likely need an extensive work up including imaging. Alisha voiced understanding of this and will inform pt. Garey Ham, RN

## 2022-02-23 NOTE — Consults (Signed)
Gordon Hobbs  Ophthalmology Initial Consult Note      Gordon Hobbs, Gordon Hobbs, 32 y.o. male  Date of Service:  02/23/2022      Requesting physician: No att. providers found    Information Obtained from: patient  Chief Complaint:  blurred vision, pain with eye movements    HPI/Discussion:   Pt reports that for the last 12 days is having pain with right eye movements. Also c/o difficulty focussing with that eye, and colored spots (white and green) like an after image out of the right eye. Denies new floaters, denies diplopia. Went to an outpt optom yesterday who thought it was an optic nerve problem. Was told to come to [facility] ED for further eval.   Denies recent trauma, tick bites. Had to stop driving due to his eye issues.   Denies hx of STDs. Endorses smoking marijuana. Does not have any medical problems. C/o of pressure behind his eyes and around his temples. Denies headaches, denies history of migraines.     Past Ocular Hx:  Refractive error  Ocular Meds:  glasses  Family Ocular Hx: mom recently diagnosed with MS    No past medical history on file.              Prior to Admission Meds:   Cannot display prior to admission medications because the patient has not been admitted in this contact.            Inpatient Meds:  No current facility-administered medications for this encounter.      No Known Allergies  Social History     Tobacco Use    Smoking status: Not on file    Smokeless tobacco: Not on file   Substance Use Topics    Alcohol use: Not on file       ROS: Other than ROS in the HPI, all other systems were negative    Neuro:  Oriented to person, place, and time:  Yes  Psychiatric:  Mood and Affect Appropriate:  Yes    Exam:  Temperature: 36.2 C (97.2 F)  Heart Rate: 87  BP (Non-Invasive): (!) 126/90  Respiratory Rate: 18  SpO2: 96 %    Visual Acuity:   Near without glasses  ph  Distance    OD 20/30  NI      OS 20/20  -       Color : 11/16 OD, 16/16 OS (isihara)     OD OS   Confr Vis Fields WNL WNL   EOM  (Primary) Trace abduction deficit WNL   Conjunctiva - Palpebral    WNL WNL   Conjunctiva - Bulbar WNL WNL   Adnexa  WNL WNL   Pupils  WNL WNL   Reaction, Direct WNL WNL                  Consensual WNL WNL                  RAPD No No   Cornea  WNL WNL   Anterior Chamber WNL WNL   Lens:  WNL WNL   IOP by Tonopen 18 20   Optic Disc - C:D Ratio 0.3 0.3                      Appearance  360 disc elevation and blurred margins, no vessel obscuration, no disc heme WNL   Post Seg:  Retina  Vessels WNL WNL                   Vitreous  WNL WNL                   Macula WNL WNL                   Periphery WNL WNL       Pupil Dilation: Dilation Medications:   Cyclomydryl       Labs/imaging: none    A/P:   32 y.o. presents with 12 days of visual disturbance in the right eye. Eye exam as above, notable for mild decline in [place], decreased color vision, and pain with eye movements, and grade 2 disc edema in the right eye. Concerning for optic neuritis, will need to rule out compressive optic neuropathy, infectious and inflammatory etiologies.    Recommendations:  - - please obtain MRI Brain WWO, MRI Orbits WWO, MRV head WWO.   - please consult neurology evaluation, appreciate assistance.   - Recommend admission to the neurology service if MRI cannot be performed during the day.   - recommend obtaining Serum:   - CBC, BMP, ESR, CRP, LFT, PT/PTT  - ANA, ACE, Lysozyme, MMA, vitamin B12, NMO  - FTA-ABS, RPR,  Bartonella AB, Lyme AB, Quantiferon gold,   -  Please consider LP for careful opening pressure and CSF evaluation.   - CSF ACE AB, Lyme AB, bacterial culture, cryp antigen, cell count with diff, glucose, protein, cytopathology, oligoclonal bands (multiple sclerosis panel), VDRL, VZV, HSV, NMO.  - need neuroimaging cleared before LP        Please place formal consult order in Epic if not already completed.      Appreciate the consultation.     Nathaniel Man, MD 02/23/2022, 18:55      Patient's eyes were dilated at 6:50  pm.  If there are any concerns or questions about dilation, please page the ophthalmology resident on call.        I did not see the patient.  I reviewed the resident's note.  I agree with the findings and plan of care as documented in the resident's note.  Any exceptions/additions are edited/noted.    Candida Peeling, MD

## 2022-02-23 NOTE — ED Triage Notes (Signed)
[facility] - Emergency Department   Provider in Triage Note     Patient Name: Gordon Hobbs  Patient MRN: [id number]  Date and Time of Assessment: 02/23/2022 15:01     Filed Vitals:    02/23/22 1321   BP: (!) 126/90   Pulse: 87   Resp: 18   Temp: 36.2 C (97.2 F)   SpO2: 96%       Chief Complaint   Patient presents with    Eye Problem     Has been having vision issues in his right eye x 12 days. Was evaluated by opthalmology and was told he may be having optic nerve issues.        Brief HPI:  Patient presenting for right eye vision problems.  Reports that started 12 13 days ago.  Reporting distortion of vision and blurriness in the central aspect of his vision.  Reports right eye pressure.  Patient reports he stopped driving 5 days ago due to symptoms.    Physical Exam:   No conjunctivitis  Pupils equal and reactive  Pain with extraocular movements, though they are intact  Alert and oriented      Preliminary Plan:  Labs ordered  Patient will return to waiting room      Clarice Pole, PA

## 2022-02-24 DIAGNOSIS — H4710 Unspecified papilledema: Secondary | ICD-10-CM

## 2022-02-24 DIAGNOSIS — H469 Unspecified optic neuritis: Secondary | ICD-10-CM | POA: Diagnosis present

## 2022-02-24 LAB — LYME ANTIBODY PANEL WITH REFLEX: LYME ANTIBODY TOTAL (Screen): NEGATIVE

## 2022-02-24 LAB — LDH, CSF: LDH CSF: <30 U/L

## 2022-02-24 LAB — ANA SCREEN BY IFA WITH REFLEX TO TITER/PATTERN AND SEROLOGY CASCADE, SERUM: ANA INTERPRETATION: NEGATIVE

## 2022-02-24 LAB — CBC WITH DIFF
BASOPHIL #: <0.1 10*3/uL (ref <=0.20)
BASOPHIL %: 0.6 %
EOSINOPHIL #: 0.16 10*3/uL (ref <=0.50)
EOSINOPHIL %: 1.6 %
HCT: 41.6 % (ref 38.9–52.0)
HGB: 14.3 g/dL (ref 13.4–17.5)
IMMATURE GRANULOCYTE #: <0.1 10*3/uL (ref <0.10)
IMMATURE GRANULOCYTE %: 0.3 % (ref 0.0–1.0)
LYMPHOCYTE #: 3.44 10*3/uL (ref 1.00–4.80)
LYMPHOCYTE %: 34.9 %
MCH: 29.7 pg (ref 26.0–32.0)
MCHC: 34.4 g/dL (ref 31.0–35.5)
MCV: 86.5 fL (ref 78.0–100.0)
MONOCYTE #: 0.59 10*3/uL (ref 0.20–1.10)
MONOCYTE %: 6 %
MPV: 10 fL (ref 8.7–12.5)
NEUTROPHIL #: 5.59 10*3/uL (ref 1.50–7.70)
NEUTROPHIL %: 56.6 %
PLATELETS: 247 10*3/uL (ref 150–400)
RBC: 4.81 10*6/uL (ref 4.50–6.10)
RDW-CV: 12.2 % (ref 11.5–15.5)
WBC: 9.9 10*3/uL (ref 3.7–11.0)

## 2022-02-24 LAB — KEEP FROZEN

## 2022-02-24 LAB — BASIC METABOLIC PANEL
ANION GAP: 6 mmol/L (ref 4–13)
BUN/CREA RATIO: 21 (ref 6–22)
BUN: 16 mg/dL (ref 8–25)
CALCIUM: 8.7 mg/dL (ref 8.6–10.2)
CHLORIDE: 108 mmol/L (ref 96–111)
CO2 TOTAL: 23 mmol/L (ref 22–30)
CREATININE: 0.78 mg/dL (ref 0.75–1.35)
ESTIMATED GFR - MALE: >90 mL/min/BSA (ref >=60)
GLUCOSE: 113 mg/dL (ref 65–125)
POTASSIUM: 3.2 mmol/L — ABNORMAL LOW (ref 3.5–5.1)
SODIUM: 137 mmol/L (ref 136–145)

## 2022-02-24 LAB — BODY FLUID CELL COUNT WITH DIFFERENTIAL
NUCLEATED CELLS, FLUID: 1 /uL (ref 0–5)
RBC COUNT: 1 /uL

## 2022-02-24 LAB — BODY FLUID CSF MAN DIFF

## 2022-02-24 LAB — LIPID PANEL
CHOL/HDL RATIO: 5.7
CHOLESTEROL: 172 mg/dL (ref 100–200)
HDL CHOL: 30 mg/dL — ABNORMAL LOW (ref >=50)
LDL CALC: 112 mg/dL — ABNORMAL HIGH (ref <100)
NON-HDL: 142 mg/dL (ref <=190)
TRIGLYCERIDES: 168 mg/dL — ABNORMAL HIGH (ref <150)
VLDL CALC: 28 mg/dL (ref <30)

## 2022-02-24 LAB — GLUCOSE CSF: GLUCOSE CSF: 58 mg/dL — ABNORMAL LOW (ref 60–80)

## 2022-02-24 LAB — PHOSPHORUS: PHOSPHORUS: 3.8 mg/dL (ref 2.4–4.7)

## 2022-02-24 LAB — VITAMIN B12: VITAMIN B 12: 335 pg/mL (ref 200–900)

## 2022-02-24 LAB — MAGNESIUM: MAGNESIUM: 2 mg/dL (ref 1.8–2.6)

## 2022-02-24 LAB — FOLATE: FOLATE: 11.9 ng/mL (ref 7.0–31.0)

## 2022-02-24 LAB — PROTEIN CSF: PROTEIN CSF: 24 mg/dL (ref 15–45)

## 2022-02-24 MED ORDER — ONDANSETRON HCL (PF) 4 MG/2 ML INJECTION SOLUTION
4.0000 mg | Freq: Four times a day (QID) | INTRAMUSCULAR | Status: DC | PRN
Start: 2022-02-24 — End: 2022-02-27

## 2022-02-24 MED ORDER — DIAZEPAM 5 MG/ML INJECTION SYRINGE
5.0000 mg | INJECTION | INTRAMUSCULAR | Status: AC
Start: 2022-02-24 — End: 2022-02-24
  Administered 2022-02-24: 5 mg via INTRAVENOUS
  Filled 2022-02-24: qty 2

## 2022-02-24 MED ORDER — ACETAMINOPHEN 325 MG TABLET
650.0000 mg | ORAL_TABLET | ORAL | Status: DC | PRN
Start: 2022-02-24 — End: 2022-02-27
  Administered 2022-02-24 – 2022-02-26 (×2): 650 mg via ORAL
  Filled 2022-02-24 (×2): qty 2

## 2022-02-24 MED ORDER — POTASSIUM BICARBONATE-CITRIC ACID 20 MEQ EFFERVESCENT TABLET
40.0000 meq | EFFERVESCENT_TABLET | ORAL | Status: AC
Start: 2022-02-24 — End: 2022-02-24
  Administered 2022-02-24: 40 meq via ORAL
  Filled 2022-02-24: qty 2

## 2022-02-24 MED ORDER — ENOXAPARIN 40 MG/0.4 ML SUBCUTANEOUS SYRINGE
40.0000 mg | INJECTION | SUBCUTANEOUS | Status: DC
Start: 2022-02-24 — End: 2022-02-27
  Administered 2022-02-24 – 2022-02-27 (×4): 40 mg via SUBCUTANEOUS
  Filled 2022-02-24 (×4): qty 0.4

## 2022-02-24 NOTE — Procedures (Signed)
Procedure Date:  02/24/2022  Time:  1500  Procedure: Lumbar Puncture  Diagnosis:  Optic Neuritis  Indication:  Diagnostic Tap    Description: After informed consent a surgical time out was performed to confirm correct patient and procedure. Patient was placed in right decubitus position; the L4-L5 lumbar cistern was palpated and identified The site was identified and prepped in the usual sterile fashion. The skin was prepped with chlorhexidine and 1% analgesia was used. A 22 gauge LP needle was inserted into the lumbar region at the L3-L4 interspace.  There was return of CSF fluid. 20 cc of CSF was collected.  Patient tolerated procedure without complications.     Dr. Roderic Palau was available the entire procedure.      Ulyses Southward, MD, MPH   Neurology, PGY-3  [facility]  PAGER: Alinda Dooms

## 2022-02-24 NOTE — Consults (Signed)
[facility]  OPHTHALMOLOGY Consult Follow Up    Gordon Hobbs, Gordon Hobbs, 32 y.o. male  Date of Birth:  05-01-1990  Date of Service:  02/24/2022    F/U evalutation of:  right optic neuritis    Subjective:  Doing well today. No changes in vision since yesterday.    Data:    Mood and Affect Normal:  Yes  Mental Status - Oriented to person, place, and time:  Yes    Visual Acuity:  sc  cc  ph  near    OD  +     20/30    OS  +     20/20       Intraocular Pressure: by Palpation  OD:  STP     OS:  STP      Ocular Motility and Alignment:    Trace abduction deficit OD    Visual fields by confrontation:    Normal OU     OD OS   Adnexa WNL WNL   Cornea WNL WNL   Anterior Chamber WNL WNL   Iris/pupils   WNL WNL   APD NO No   Lens WNL WNL     Pupil Dilation:  not done    Labs/Images:    Results for orders placed or performed during the hospital encounter of 02/23/22 (from the past 24 hour(s))   CBC/DIFF    Narrative    The following orders were created for panel order CBC/DIFF.  Procedure                               Abnormality         Status                     ---------                               -----------         ------                     CBC WITH [id number]                                    Final result                 Please view results for these tests on the individual orders.   BASIC METABOLIC PANEL   Result Value Ref Range    SODIUM 140 136 - 145 mmol/L    POTASSIUM 4.0 3.5 - 5.1 mmol/L    CHLORIDE 107 96 - 111 mmol/L    CO2 TOTAL 28 22 - 30 mmol/L    ANION GAP 5 4 - 13 mmol/L    CALCIUM 9.0 8.6 - 10.2 mg/dL    GLUCOSE 75 65 - 125 mg/dL    BUN 17 8 - 25 mg/dL    CREATININE 0.92 0.75 - 1.35 mg/dL    BUN/CREA RATIO 18 6 - 22    ESTIMATED GFR - MALE >90 >=60 mL/min/BSA   CBC WITH DIFF   Result Value Ref Range    WBC 8.8 3.7 - 11.0 x10^3/uL    RBC 4.98 4.50 - 6.10 x10^6/uL    HGB 15.1 13.4 - 17.5 g/dL    HCT 43.0 38.9 -  52.0 %    MCV 86.3 78.0 - 100.0 fL    MCH 30.3 26.0 - 32.0 pg    MCHC 35.1 31.0 - 35.5  g/dL    RDW-CV 12.3 11.5 - 15.5 %    PLATELETS 255 150 - 400 x10^3/uL    MPV 9.9 8.7 - 12.5 fL    NEUTROPHIL % 62.5 %    LYMPHOCYTE % 31.4 %    MONOCYTE % 4.4 %    EOSINOPHIL % 0.8 %    BASOPHIL % 0.6 %    NEUTROPHIL # 5.48 1.50 - 7.70 x10^3/uL    LYMPHOCYTE # 2.75 1.00 - 4.80 x10^3/uL    MONOCYTE # 0.39 0.20 - 1.10 x10^3/uL    EOSINOPHIL # <0.10 <=0.50 x10^3/uL    BASOPHIL # <0.10 <=0.20 x10^3/uL    IMMATURE GRANULOCYTE % 0.3 0.0 - 1.0 %    IMMATURE GRANULOCYTE # <0.10 <0.10 x10^3/uL   C-REACTIVE PROTEIN(CRP)   Result Value Ref Range    CRP INFLAMMATION 0.9 <8.0 mg/L   SEDIMENTATION RATE   Result Value Ref Range    ERYTHROCYTE SEDIMENTATION RATE (ESR) 10 0 - 15 mm/hr   HEPATIC FUNCTION PANEL   Result Value Ref Range    ALBUMIN 4.3 3.5 - 5.0 g/dL     ALKALINE PHOSPHATASE 65 45 - 115 U/L    ALT (SGPT) 26 10 - 55 U/L    AST (SGOT)  19 8 - 45 U/L    BILIRUBIN TOTAL 0.4 0.3 - 1.3 mg/dL    BILIRUBIN DIRECT 0.1 0.1 - 0.4 mg/dL    PROTEIN TOTAL 7.2 6.4 - 8.3 g/dL   PT/INR   Result Value Ref Range    PROTHROMBIN TIME 12.6 9.8 - 14.4 seconds    INR 1.03 0.80 - 1.20    Narrative    Coumadin therapy INR range for Conventional Anticoagulation is 2.0 to 3.0 and for Intensive Anticoagulation 2.5 to 3.5.   PTT (PARTIAL THROMBOPLASTIN TIME)   Result Value Ref Range    APTT 34.2 24.2 - 37.5 seconds    Narrative    Therapeutic range for unfractionated heparin is 60-100 seconds.   SYPHILIS SCREENING ALGORITHM WITH REFLEX, SERUM   Result Value Ref Range    SYPHILIS TP ANTIBODIES Non-reactive Non-reactive   LIPID PANEL -TOMORROW   Result Value Ref Range    CHOLESTEROL  172 100 - 200 mg/dL    HDL CHOL 30 (L) >=50 mg/dL    TRIGLYCERIDES 168 (H) <150 mg/dL    LDL CALC 112 (H) <100 mg/dL    VLDL CALC 28 <30 mg/dL    NON-HDL 142 <=190 mg/dL    CHOL/HDL RATIO 5.7    CBC/DIFF    Narrative    The following orders were created for panel order CBC/DIFF.  Procedure                               Abnormality         Status                      ---------                               -----------         ------  CBC WITH [id number]                                    Final result                 Please view results for these tests on the individual orders.   BASIC METABOLIC PANEL   Result Value Ref Range    SODIUM 137 136 - 145 mmol/L    POTASSIUM 3.2 (L) 3.5 - 5.1 mmol/L    CHLORIDE 108 96 - 111 mmol/L    CO2 TOTAL 23 22 - 30 mmol/L    ANION GAP 6 4 - 13 mmol/L    CALCIUM 8.7 8.6 - 10.2 mg/dL    GLUCOSE 113 65 - 125 mg/dL    BUN 16 8 - 25 mg/dL    CREATININE 0.78 0.75 - 1.35 mg/dL    BUN/CREA RATIO 21 6 - 22    ESTIMATED GFR - MALE >90 >=60 mL/min/BSA   MAGNESIUM   Result Value Ref Range    MAGNESIUM 2.0 1.8 - 2.6 mg/dL   PHOSPHORUS   Result Value Ref Range    PHOSPHORUS 3.8 2.4 - 4.7 mg/dL   CBC WITH DIFF   Result Value Ref Range    WBC 9.9 3.7 - 11.0 x10^3/uL    RBC 4.81 4.50 - 6.10 x10^6/uL    HGB 14.3 13.4 - 17.5 g/dL    HCT 41.6 38.9 - 52.0 %    MCV 86.5 78.0 - 100.0 fL    MCH 29.7 26.0 - 32.0 pg    MCHC 34.4 31.0 - 35.5 g/dL    RDW-CV 12.2 11.5 - 15.5 %    PLATELETS 247 150 - 400 x10^3/uL    MPV 10.0 8.7 - 12.5 fL    NEUTROPHIL % 56.6 %    LYMPHOCYTE % 34.9 %    MONOCYTE % 6.0 %    EOSINOPHIL % 1.6 %    BASOPHIL % 0.6 %    NEUTROPHIL # 5.59 1.50 - 7.70 x10^3/uL    LYMPHOCYTE # 3.44 1.00 - 4.80 x10^3/uL    MONOCYTE # 0.59 0.20 - 1.10 x10^3/uL    EOSINOPHIL # 0.16 <=0.50 x10^3/uL    BASOPHIL # <0.10 <=0.20 x10^3/uL    IMMATURE GRANULOCYTE % 0.3 0.0 - 1.0 %    IMMATURE GRANULOCYTE # <0.10 <0.10 x10^3/uL   VITAMIN B12   Result Value Ref Range    VITAMIN B 12  335 200 - 900 pg/mL   QUANTIFERON TB GOLD PLUS, BLOOD    Narrative    The following orders were created for panel order QUANTIFERON TB GOLD PLUS, BLOOD.  Procedure                               Abnormality         Status                     ---------                               -----------         ------                     Laurance Flatten TB [id number]  In process                 QUANTIFERON [id number]                                  In process                 QUANTIFERON [id number]                                  In process                 QUANTIFERON TB [id number]                               In process                   Please view results for these tests on the individual orders.   FOLATE   Result Value Ref Range    FOLATE 11.9 7.0 - 31.0 ng/mL         MRV INTRACRANIAL W/WO CONTRAST   Final Result    As above.      MRI ORBITS W/WO CONTRAST   Final Result   1.  Findings suggestive of right optic neuritis.   2. Normal brain MRI.      MRI BRAIN W/WO CONTRAST   Final Result   1.  Findings suggestive of right optic neuritis.   2. Normal brain MRI.          Plan(s)/Recommendation(s):    32 y.o. presents with 12 days of visual disturbance in the right eye. Eye exam as above, notable for mild decline in [place], decreased color vision, and pain with eye movements, and grade 2 disc edema in the right eye. Concerning for optic neuritis, will need to rule out compressive optic neuropathy, infectious and inflammatory etiologies. MRI orbits: suggesting right optic neuritis.     Recommendations:  - recommend obtaining Serum:              - CBC, BMP, ESR, CRP, LFT, PT/PTT  - ANA, ACE, Lysozyme, MMA, vitamin B12, NMO: pending  - FTA-ABS, RPR: non-reactive;  Bartonella AB, Lyme AB, Quantiferon gold,   -  Please consider LP for careful opening pressure and CSF evaluation.              - CSF ACE AB, Lyme AB, bacterial culture, cryp antigen, cell count with diff, glucose, protein, cytopathology, oligoclonal bands (multiple sclerosis panel), VDRL, VZV, HSV, NMO.  - need neuroimaging cleared before LP    - if infectious workup remains negative will recommend treatment of optic neuritis per ONTT trial:   IV methylpred 1g/day x 3 days followed by PO pred '1mg'$ /kg/day for 11 days followed by taper over 4 days.        Please place formal consult order in Epic if not  already completed.      Nathaniel Man, MD 02/24/2022, 08:27      I saw and examined the patient.  I reviewed the resident's note.  I agree with the findings and plan of care as documented in the resident's note.  Any exceptions/additions are edited/noted.    Fortunato Curling, MD

## 2022-02-24 NOTE — ED Nurses Note (Signed)
Verbal report called to RN assuming care on floor, no voiced questions at this time. Will arrange transportation.

## 2022-02-24 NOTE — ED Nurses Note (Signed)
Pt transported to floor at this time.

## 2022-02-24 NOTE — Nurses Notes (Signed)
Pt admitted to 10W 1005 from ED for optic neuritis. Pt awaiting LP for further workup.  Assessments to follow via flowsheets. Pt ambulates independently, visual symptoms do not affect his ability to safely ambulate. Pt placed on low fall precautions, bed in low position, room clutter free. Wife and infant at bedside.   Paged service for admitting orders and diet order.

## 2022-02-24 NOTE — H&P (Signed)
[facility]  Neurology H&P       Garnell, Exner, 32 y.o. male  Date of Admission:  02/23/2022  Date of Birth:  [birth year]    PCP: No Pcp    Information obtained from: patient    Chief Complaint: Right eye vision loss  Admitted for: Optic neuritis    HISTORY OF PRESENTING ILLNESS:      HPI: Gordon Hobbs is a 32 y.o., White male with no significant PMH who presents with right eye vision loss. Patient reports that about 12 days ago he woke up and noticed that there was a halo of vision loss in the middle of his right eye. He reports that shapes inside it look distorted and that it is darker than his peripheral vision. He also noticed that his peripheral vision is intact. He reports that he has eye pain when he moves his eye, especially upwards and to the right. He denies any symptoms in the left eye. He denies having any associated symptoms, which includes, headache, dizziness, double vision, facial droop, dysarthria, dysphagia, ones sided weakness or numbness, gait abnormalitiy, urinary or bowel symptoms. He denies having any previous events of similar nature or ones that included any of the mentioned symptoms. He reports that his vision since then has worsened in the sense that it got darker and the halo got larger. He denies any recent tick bites or illness.     Admission Source:  Home  Advance Directives:  None-Discussed  Hospice involvement prior to admission?  Not applicable    MEDICAL HISTORY:    PAST MEDICAL & SURGICAL HISTORIES:   No past medical history on file.      No past surgical history on file.      HOME MEDICATIONS:  No outpatient medications have been marked as taking for the 02/23/22 encounter [facility] Encounter).        ALLERGIES:  He has No Known Allergies.     FAMILY HISTORY:  His family history is not on file.    SOCIAL HISTORY:  - He  has no history on file for alcohol use.      - He  has no history on file for tobacco use.     - He  has no history on file for  drug use.    REVIEW OF SYSTEMS:      ROS: Other than ROS in the HPI, all other systems were negative.    EXAMINATION:      Temperature: 36.4 C (97.6 F)  Heart Rate: 53  BP (Non-Invasive): 116/74  Respiratory Rate: 18  SpO2: 97 %    Physical Exam:  General: appears in good health, comfortable  HENT:Head atraumatic and normocephalic  Neck: no thyromegaly or lymphadenopathy  Extremities: No cyanosis or deformity  Ophthalomscopic: normal w/o hemorrhages, exudates, or papilledema  Glasgow: Eye opening:  4 spontaneous, Verbal response:  5 oriented, Best motor response:  6 obeys commands  Mental status:  Level of Consciousness: alert  Orientations: Alert and oriented x 3  MemoryRegistration, Recall, and Following of commands is normal  AttentionsAttention and Concentration are normal  Knowledge: Good  Language: Normal  Speech: Normal  Cranial nerves:   CN2: 20/20 in left eye, 20/100 in right eye, no visual field defect  CN 3,4,6: EOMI, PERRLA  CN 5: Facial sensation intact  CN 7: Face symmetrical  CN 8: Hearing grossly intact  CN 9,10: Palate symmetric  CN 11: Sternocleidomastoid and Trapezius have normal strength.  CN 12: Tongue normal with no fasiculations or deviation  Gait, Coordination, and Reflexes:   Gait: Normal  Coordination: Coordination is normal without tremor    Muscle tone: WNL  Muscle exam    Upper Extremity Right Left Lower Extremity Right Left   Deltoid 5/5 5/5 Iliopsoas 5/5 5/5   Biceps 5/5 5/5 Quads 5/5 5/5   Triceps 5/5 5/5 Hamstrings 5/5 5/5   Wrist Extension 5/5 5/5 Ankle Dorsi Flexion 5/5 5/5   Wrist Flexion 5/5 5/5 Ankle Plantar Flexion 5/5 5/5   Interossei 5/5 5/5 Ankle Eversion 5/5 5/5   APB 5/5 5/5 Ankle Inversion 5/5 5/5       Reflexes   RJ BJ TJ KJ AJ Plantars Hoffman's   Right 2+ 2+ 2+ 2+ 2+ Downgoing Not present   Left 2+ 2+ 2+ 2+ 2+ Downgoing Not present     Sensory: Sensory exam in the upper and lower extremities is normal  Normal color, texture and turgor without significant lesions or  rashes    LABS  IMAGING  OTHER REPORTS:     Labs:    I have reviewed all lab results.  Lab Results Today:    Results for orders placed or performed during the hospital encounter of 02/23/22 (from the past 24 hour(s))   BASIC METABOLIC PANEL   Result Value Ref Range    SODIUM 140 136 - 145 mmol/L    POTASSIUM 4.0 3.5 - 5.1 mmol/L    CHLORIDE 107 96 - 111 mmol/L    CO2 TOTAL 28 22 - 30 mmol/L    ANION GAP 5 4 - 13 mmol/L    CALCIUM 9.0 8.6 - 10.2 mg/dL    GLUCOSE 75 65 - 125 mg/dL    BUN 17 8 - 25 mg/dL    CREATININE 0.92 0.75 - 1.35 mg/dL    BUN/CREA RATIO 18 6 - 22    ESTIMATED GFR - MALE >90 >=60 mL/min/BSA   CBC WITH DIFF   Result Value Ref Range    WBC 8.8 3.7 - 11.0 x10^3/uL    RBC 4.98 4.50 - 6.10 x10^6/uL    HGB 15.1 13.4 - 17.5 g/dL    HCT 43.0 38.9 - 52.0 %    MCV 86.3 78.0 - 100.0 fL    MCH 30.3 26.0 - 32.0 pg    MCHC 35.1 31.0 - 35.5 g/dL    RDW-CV 12.3 11.5 - 15.5 %    PLATELETS 255 150 - 400 x10^3/uL    MPV 9.9 8.7 - 12.5 fL    NEUTROPHIL % 62.5 %    LYMPHOCYTE % 31.4 %    MONOCYTE % 4.4 %    EOSINOPHIL % 0.8 %    BASOPHIL % 0.6 %    NEUTROPHIL # 5.48 1.50 - 7.70 x10^3/uL    LYMPHOCYTE # 2.75 1.00 - 4.80 x10^3/uL    MONOCYTE # 0.39 0.20 - 1.10 x10^3/uL    EOSINOPHIL # <0.10 <=0.50 x10^3/uL    BASOPHIL # <0.10 <=0.20 x10^3/uL    IMMATURE GRANULOCYTE % 0.3 0.0 - 1.0 %    IMMATURE GRANULOCYTE # <0.10 <0.10 x10^3/uL   C-REACTIVE PROTEIN(CRP)   Result Value Ref Range    CRP INFLAMMATION 0.9 <8.0 mg/L   SEDIMENTATION RATE   Result Value Ref Range    ERYTHROCYTE SEDIMENTATION RATE (ESR) 10 0 - 15 mm/hr   HEPATIC FUNCTION PANEL   Result Value Ref Range    ALBUMIN 4.3 3.5 - 5.0 g/dL  ALKALINE PHOSPHATASE 65 45 - 115 U/L    ALT (SGPT) 26 10 - 55 U/L    AST (SGOT)  19 8 - 45 U/L    BILIRUBIN TOTAL 0.4 0.3 - 1.3 mg/dL    BILIRUBIN DIRECT 0.1 0.1 - 0.4 mg/dL    PROTEIN TOTAL 7.2 6.4 - 8.3 g/dL   PT/INR   Result Value Ref Range    PROTHROMBIN TIME 12.6 9.8 - 14.4 seconds    INR 1.03 0.80 - 1.20   PTT (PARTIAL  THROMBOPLASTIN TIME)   Result Value Ref Range    APTT 34.2 24.2 - 37.5 seconds   SYPHILIS SCREENING ALGORITHM WITH REFLEX, SERUM   Result Value Ref Range    SYPHILIS TP ANTIBODIES Non-reactive Non-reactive       Review of reports and notes reveal:     I have reviewed the following imaging reports:    MRV INTRACRANIAL W/WO CONTRAST   Final Result by Edi, Radresults In (02/22 2317)    As above.      MRI ORBITS W/WO CONTRAST   Final Result by Edi, Radresults In (02/22 2330)   1.  Findings suggestive of right optic neuritis.   2. Normal brain MRI.      MRI BRAIN W/WO CONTRAST   Final Result by Edi, Radresults In (02/22 2330)   1.  Findings suggestive of right optic neuritis.   2. Normal brain MRI.           I have reviewed these other reports & notes:    None to review    ASSESSMENT:      Independent Interpretation of images or specimens:  1. MRI of the Brain performed on 02/23/22 on [facility] PACS and it shows Enhancement of the right optic nerve concerning for ON.   2. MRI of the Orbits performed on 02/23/22 on [place] [facility] and it shows Enhancement of the right optic nerve concerning for ON.   3. MRV Intracranial performed on 02/23/22 on [facility] PACS and it shows No venous sinus thrombosis.     Bary Ferraiolo is a 32 y.o., White male 104 o significant PMH who presents with Right eye vision loss. He also reports associated pain with eye movement. He denies having contralateral symptoms or any other neurological deficits. Exam is unremarkable except for significantly reduced vision acuity in the right eye. MRI of the brain and orbits demonstrate evidence of ON. CRP and ESR are WNL. Patient is admitted to the General Neurology service for further evaluation and treatment.     Active Hospital Problems    Diagnosis    Primary Problem: Optic neuritis        PLAN:      Optic Neuritis  Can be demyelinating vs infectious in nature  -Admit to General Neurology  -Neuro-checks and Vital signs  -MRI Brain & Orbits W/WO contrast - R Optic  Nerve Enhancement  -MRV Intracranial - No evidence of CVST  -CBC, BMP, Mag, Phos, CRP, ESR, Hepatic Panel, Lipid Profile, Syphilis all within normal limits.   -Ophthalmology Consulted, Appreciate Recommendations;  -Recommend obtaining Serum:  - CBC, BMP, ESR, CRP, LFT, PT/PTT  - ANA, ACE, Lysozyme, MMA, vitamin B12, NMO  - FTA-ABS, RPR,  Bartonella AB, Lyme AB, Quantiferon gold,   -LP for careful opening pressure and CSF evaluation.  - CSF ACE AB, Lyme AB, bacterial culture, cryp antigen, cell count with diff, glucose, protein, cytopathology, oligoclonal bands (multiple sclerosis panel), VDRL, CMV, VZV, HSV, NMO, MOG.  -Will  consider High dose steroids if evidence of inflammatory process. Will consider appropriate antimicrobials if there is evidence of Infectious process.       ___    Date of Last Bowel Movement: 02/23/22  DVT/PE Prophylaxis - Enoxaparin  Consults - none  Hardware (Lines, Drains, Foley, Tubes) - PIV  Diet - Regular  Activity - Up w/ assistance and Up in chair TID w/ all meals  Therapy - None  Disposition Planning - TBD; Likely Home discharge     Code Status this admission:  Full Code  Palliative/Supportive Care consulted?  no  Hospice Consulted?  Not applicable    Neurology Related Comorbid Conditions  Severe Brain Conditions: Severe Brain Conditions: NA  Coma (GCS less than 8): Coma -Not applicable  TIA: not applicable  Encephalopathy: no  Encephalitis: Encephalitis-Not applicable  Seizure: Seizure-Not applicable   Respiratory:  N/A  Renal Failure: Renal Failure: No  Coagulopathy: Not applicable  Sodium Disturbances: N/A  Fatigue/Debility: No Weakness     Jonita Albee, MD - 02/24/2022  [facility]      Senior addendum     Ellard Rosa is 32 yo male with no significant past medical history presented [facility] emergency department c/o blurred vision and pain with EOM on the right eye 12-13 days duration.  Reportedly 12 days ago patient woke up with a big halo (circle) in the middle  part of his vision field on the right eye with vision distortion and blurriness.  Also reports pain in the eye with EOM in the right horizontal and upgaze.  Also feels pressure in the right eye.  Patient reports he stopped driving 5 days ago due to symptoms.  Patient went to outside optometrist yesterday and was told he may have optic nerve problem and was told to go to the ED. patient denies diplopia, paresthesia, focal weakness, bowel bladder symptoms, gait abnormality or balance issues.  Denies any recent infection.  Denies headache, intermittent claudication.  His mother was recently diagnosed with morphine MS.  Was evaluated by Ophthalmology in the ED:  OD 20/30, OS 20/20, color 11/16 OD, 16/16 OS.  On my exam visual acuity on the right eye was 20/100, on the left eye 20/20.  Decreased color perception on the right eye.  No signs of spine involvement on exam (sensory level, hyperreflexia) Concern for optic neuritis.  Recommended MRI brain, orbits, MRV w/wo as well as serum and CSF studies for infectious, inflammatory, demyelinating, and other etiologies. MRI's showed focal short segment enhancement of the right optic nerve at the orbital apex with associated T2 signal abnormality. Optic globe and orbital musculature are normal.  Patient admitted to General Neurology Service to complete the workup.  Not doing spine imaging at this time as the yield is very low in optic neuritis .  If other etiologies are excluded and if this is optic neuritis, will treat the patient with steroids (ONTT).      Post Rounds Addendum:  Patient was seen and examined by day team. No changes to above plan of care. Discussed all results and pending tests. Discussed Lumbar Puncture of which patient and wife are agreeable. Further recommendations per consult team pending, but for now will continue to proceed with plan as discussed.       Ulyses Southward, MD, MPH   02/24/2022, 10:47  Neurology, PGY-3  [place]  PAGER: SPOK    I saw  and examined the patient.  I reviewed the resident's note.  I agree  with the findings and plan of care as documented in the resident's note.  Any exceptions/additions are edited/noted.    Lianne Moris, MD, PhD 02/24/2022, 11:37

## 2022-02-24 NOTE — Care Management Notes (Signed)
[facility] Management Initial Evaluation    Patient Name: Gordon Hobbs  Date of Birth: 03-Mar-1990  Sex: male  Date/Time of Admission: 02/23/2022  8:47 PM  Room/Bed: 05/A  Payor: /   Primary Care Providers:  Pcp, No (General)    Pharmacy Info:   Preferred Pharmacy       None          Emergency Contact Info:   Extended Emergency Contact Information  Primary Emergency Contact: earl koob  Mobile Phone: [phone]  Relation: Grandfather    History:   Gordon Hobbs is a 32 y.o., male, admitted for Optic Neuritis.    Height/Weight: 175.3 cm ('5\' 9"'$ ) / 70.3 kg (154 lb 15.7 oz)     LOS: 0 days   Admitting Diagnosis: Optic neuritis [H46.9]    Assessment:      02/24/22 1148   Assessment Details   Assessment Type Admission   Date of Care Management Update 02/24/22   Date of Next DCP Update 02/27/22   Readmission   Is this a readmission? No   Insurance Information/Type   Insurance type None/Self pay   Patient Matters Referral made to Care Management Supervisor/Manager/PFS: Yes   Name of Care Management Supervisor/Manager/PFS: Financial counselors already visited pt to discuss charity care.   Employment/Financial   Patient has Prescription Coverage?  No   (No medications)   Financial Concerns inadequate insurance coverage;unable to afford medications   Living Environment   Select an age group to open "lives with" row.  Adult   Lives With child(ren), dependent;significant other  (pt's significant other, Haly, child 1 week and step child 17 years)   Living Arrangements house   Home Safety   Home Assessment: No Problems Identified   Home Accessibility ramps present at home   Custody and Legal Status   Do you have a court appointed guardian/conservator? No   Care Management Plan   Discharge Planning Status initial meeting   Projected Discharge Date 02/28/22   Discharge plan discussed with: Patient   CM will evaluate for rehabilitation potential yes   Patient choice offered to patient/family no   Form for  patient choice reviewed/signed and on chart no   Discharge Needs Assessment   Outpatient/Agency/Support Group Needs   (no previous HH)   Equipment Currently Used at Home none   Equipment Needed After Discharge none   Discharge Facility/Level of Care Needs Home (Patient/Family Member/other)(code 1)   Transportation Available family or friend will provide  (Significant other, Haly)   Referral Information   Admission Type inpatient   Address Verified verified-no changes   Arrived From home or self-care   ADVANCE DIRECTIVES   Does the Patient have an Advance Directive? No, Information Offered and Given   Patient Requests Assistance in Having Advance Directive Notarized. Yes   LAY CAREGIVER    Appointed Lay Caregiver? I Decline       MSW met with pt at bedside to complete assessment. Pt does not have a PCP and has no pharmacy because he takes no medications. Pt has no insurance, already visited by financial counselors for [employer]. Pt likely receiving LP, possible steroids and antimicrobials. Will follow.    Discharge Plan:  Home (Patient/Family Member/other) (code 1)      The patient will continue to be evaluated for developing discharge needs.     Case Manager: Marijean Heath, [place]  Phone: [phone]

## 2022-02-25 LAB — POC BLOOD GLUCOSE (RESULTS)
GLUCOSE, POC: 106 mg/dl — ABNORMAL HIGH (ref 70–105)
GLUCOSE, POC: 220 mg/dl — ABNORMAL HIGH (ref 70–105)
GLUCOSE, POC: 133 mg/dl — ABNORMAL HIGH (ref 70–105)

## 2022-02-25 LAB — ALT (SGPT): ALT (SGPT): 28 U/L (ref 10–55)

## 2022-02-25 LAB — SERUM FOR MS PROFILE

## 2022-02-25 LAB — HERPES SIMPLEX VIRUS (HSV1/HSV2), PCR, CSF OR SWAB
HERPES SIMPLEX VIRUS 1: NEGATIVE
HERPES SIMPLEX VIRUS 2: NEGATIVE

## 2022-02-25 LAB — CREATINE KINASE (CK), TOTAL, SERUM OR PLASMA: CREATINE KINASE: 108 U/L (ref 45–225)

## 2022-02-25 LAB — AST (SGOT): AST (SGOT): 24 U/L (ref 8–45)

## 2022-02-25 MED ORDER — SODIUM CHLORIDE 0.9 % INTRAVENOUS SOLUTION
1000.0000 mg | Freq: Every day | INTRAVENOUS | Status: DC
Start: 2022-02-25 — End: 2022-02-25
  Filled 2022-02-25: qty 16

## 2022-02-25 MED ORDER — LIDOCAINE 3.6 %-MENTHOL 1.25 % TOPICAL PATCH
1.0000 | MEDICATED_PATCH | Freq: Every day | CUTANEOUS | Status: DC
Start: 2022-02-26 — End: 2022-02-25

## 2022-02-25 MED ORDER — LIDOCAINE 3.6 %-MENTHOL 1.25 % TOPICAL PATCH
1.0000 | MEDICATED_PATCH | Freq: Every day | CUTANEOUS | Status: DC
Start: 2022-02-25 — End: 2022-02-27
  Administered 2022-02-25 – 2022-02-27 (×2): 1 via TRANSDERMAL
  Filled 2022-02-25 (×2): qty 1

## 2022-02-25 MED ORDER — INSULIN LISPRO 100 UNIT/ML SUB-Q SSIP
0.0000 [IU] | INJECTION | Freq: Four times a day (QID) | SUBCUTANEOUS | Status: DC | PRN
Start: 2022-02-25 — End: 2022-02-27
  Administered 2022-02-25: 4 [IU] via SUBCUTANEOUS
  Administered 2022-02-26: 0 [IU] via SUBCUTANEOUS
  Administered 2022-02-26: 2 [IU] via SUBCUTANEOUS
  Filled 2022-02-25: qty 12
  Filled 2022-02-25: qty 6

## 2022-02-25 MED ORDER — SODIUM CHLORIDE 0.9 % INTRAVENOUS SOLUTION
1000.0000 mg | Freq: Every day | INTRAVENOUS | Status: AC
Start: 2022-02-25 — End: 2022-02-27
  Administered 2022-02-25: 1000 mg via INTRAVENOUS
  Administered 2022-02-25 – 2022-02-26 (×2): 0 mg via INTRAVENOUS
  Administered 2022-02-26: 1000 mg via INTRAVENOUS
  Administered 2022-02-27: 0 mg via INTRAVENOUS
  Administered 2022-02-27: 1000 mg via INTRAVENOUS
  Filled 2022-02-25 (×3): qty 16

## 2022-02-25 MED ORDER — PANTOPRAZOLE 40 MG TABLET,DELAYED RELEASE
40.0000 mg | DELAYED_RELEASE_TABLET | Freq: Every morning | ORAL | Status: DC
Start: 2022-02-25 — End: 2022-02-27
  Administered 2022-02-25 – 2022-02-27 (×3): 40 mg via ORAL
  Filled 2022-02-25 (×3): qty 1

## 2022-02-25 MED ORDER — PANTOPRAZOLE 40 MG TABLET,DELAYED RELEASE
40.0000 mg | DELAYED_RELEASE_TABLET | Freq: Every morning | ORAL | Status: DC
Start: 2022-02-25 — End: 2022-02-25

## 2022-02-25 NOTE — Progress Notes (Signed)
[facility]  Neurology Progress Note      Gordon Hobbs, Gordon Hobbs, 32 y.o. male  Date of Admission:  02/23/2022  Date of service: 02/25/2022  Date of Birth:  Apr 30, 1990      Chief Complaint: Optic Neuritis   Pt's condition today: stable    Subjective: NAEO, LP completed yesterday with initial results not concerning for infection. Will initiate high dose steroids. Patient denies any exam changes.     Vital Signs:  Temp (24hrs) Max:36.9 C (99991111 F)      Systolic (123XX123), 0000000 , Min:111 , 123456     Diastolic (123XX123), 123456, Min:69, Max:77    Temp  Avg: 36.7 C (98 F)  Min: 36.4 C (97.5 F)  Max: 36.9 C (98.4 F)  MAP (Non-Invasive)  Avg: 85 mmHG  Min: 82 mmHG  Max: 88 mmHG  Pulse  Avg: 67  Min: 53  Max: 77  Resp  Avg: 16.7  Min: 16  Max: 18  SpO2  Avg: 97.8 %  Min: 97 %  Max: 99 %       Today's Physical Exam:  General: alert  Mental status: Alert and oriented x 3  Memory: Registration, Recall, and Following of commands is normal  Attention: Attention and Concentration are normal  Knowledge: Good  Language and Speech: Normal and Normal  Cranial nerves: Cranial nerves 2-12 are normal, except rt CN2 blurred/decreased acuity  Muscle tone: WNL  Motor strength:  Motor strength is normal throughout.  Sensory: Sensory exam in the upper and lower extremities is normal  Gait: Normal  Coordination: Coordination is normal without tremor  Reflexes: Reflexes are 2/2 throughout      Current Medications:  acetaminophen (TYLENOL) tablet, 650 mg, Oral, Q4H PRN  enoxaparin PF (LOVENOX) 40 mg/0.4 mL SubQ injection, 40 mg, Subcutaneous, Q24H  methylPREDNISolone (SOLU-medrol) 1,000 mg in NS 100 mL IVPB, 1,000 mg, Intravenous, Daily  ondansetron (ZOFRAN) 2 mg/mL injection, 4 mg, Intravenous, Q6H PRN  pantoprazole (PROTONIX) delayed release tablet, 40 mg, Oral, Daily before Breakfast  SSIP insulin lispro (HumaLOG) 100 units/mL injection, 0-12 Units, Subcutaneous, 4x/day PRN        I/O:  I/O last 24 hours:    Intake/Output Summary  (Last 24 hours) at 02/25/2022 0749  Last data filed at 02/25/2022 0055  Gross per 24 hour   Intake 580 ml   Output --   Net 580 ml     I/O current shift:  No intake/output data recorded.    Labs  Please indicate ordered or reviewed)  Reviewed: I have reviewed all lab results.    Review of reports and notes reveal:     MRI BRAIN W/WO CONTRAST    Result Date: 02/23/2022  Impression 1.  Findings suggestive of right optic neuritis. 2. Normal brain MRI.    MRI ORBITS W/WO CONTRAST    Result Date: 02/23/2022  Impression 1.  Findings suggestive of right optic neuritis. 2. Normal brain MRI.    MRV INTRACRANIAL W/WO CONTRAST    Result Date: 02/23/2022  Impression  As above.       Independent Interpretation of images or specimens:      Patient/ Family Discussion: Discussed with patient and his wife the plan of care, all parties are agreeable.     ASSESSMENT & PLAN  Active Hospital Problems    Diagnosis    Primary Problem: Optic neuritis    Gordon Hobbs is a 32 y.o., White male with no significant PMH who presents with  right eye vision loss. Patient reports that about 12 days ago he woke up and noticed that there was a halo of vision loss in the middle of his right eye.       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  - CSF ACE AB, Lyme AB, bacterial culture, cryp antigen, cell count with diff, glucose, protein, cytopathology, oligoclonal bands (multiple sclerosis panel), VDRL, CMV, VZV, HSV, NMO, MOG.  - Preliminary Results: Glucose 58, Protein 24, Nucleated cells 1, RBC 1  - Start High Dose steroids 3-5 days, First dose 2/24   - neuro and  vital checks      Ulyses Southward, MD, MPH  Neurology, PGY-3  [facility]  PAGER: SPOK    I saw and examined the patient.  I reviewed the resident's note.  I agree with the findings and plan of care as documented in the resident's note.  Any exceptions/additions are edited/noted.    Lianne Moris, MD, PhD 02/25/2022, 13:41

## 2022-02-25 NOTE — ED Attending Note (Signed)
I saw and evaluated patient. See the ED Primary Note attestation for my documentation. Any additions/exceptions will be in my attestation.     Veva Holes, MD  [facility]  Katy Fitch, MD  02/25/2022, 07:33

## 2022-02-26 DIAGNOSIS — H538 Other visual disturbances: Secondary | ICD-10-CM

## 2022-02-26 LAB — BASIC METABOLIC PANEL
ANION GAP: 10 mmol/L (ref 4–13)
BUN/CREA RATIO: 18 (ref 6–22)
BUN: 14 mg/dL (ref 8–25)
CALCIUM: 9.6 mg/dL (ref 8.6–10.2)
CHLORIDE: 107 mmol/L (ref 96–111)
CO2 TOTAL: 21 mmol/L — ABNORMAL LOW (ref 22–30)
CREATININE: 0.77 mg/dL (ref 0.75–1.35)
ESTIMATED GFR - MALE: >90 mL/min/BSA (ref >=60)
GLUCOSE: 142 mg/dL — ABNORMAL HIGH (ref 65–125)
POTASSIUM: 3.6 mmol/L (ref 3.5–5.1)
SODIUM: 138 mmol/L (ref 136–145)

## 2022-02-26 LAB — CBC WITH DIFF
BASOPHIL #: <0.1 10*3/uL (ref <=0.20)
BASOPHIL %: 0.2 %
EOSINOPHIL #: <0.1 10*3/uL (ref <=0.50)
EOSINOPHIL %: 0 %
HCT: 41.7 % (ref 38.9–52.0)
HGB: 14.7 g/dL (ref 13.4–17.5)
IMMATURE GRANULOCYTE #: 0.18 10*3/uL — ABNORMAL HIGH (ref <0.10)
IMMATURE GRANULOCYTE %: 0.8 % (ref 0.0–1.0)
LYMPHOCYTE #: 1.63 10*3/uL (ref 1.00–4.80)
LYMPHOCYTE %: 6.9 %
MCH: 30.8 pg (ref 26.0–32.0)
MCHC: 35.3 g/dL (ref 31.0–35.5)
MCV: 87.4 fL (ref 78.0–100.0)
MONOCYTE #: 0.66 10*3/uL (ref 0.20–1.10)
MONOCYTE %: 2.8 %
MPV: 10.6 fL (ref 8.7–12.5)
NEUTROPHIL #: 21.04 10*3/uL — ABNORMAL HIGH (ref 1.50–7.70)
NEUTROPHIL %: 89.3 %
PLATELETS: 264 10*3/uL (ref 150–400)
RBC: 4.77 10*6/uL (ref 4.50–6.10)
RDW-CV: 12.2 % (ref 11.5–15.5)
WBC: 23.6 10*3/uL — ABNORMAL HIGH (ref 3.7–11.0)

## 2022-02-26 LAB — MAGNESIUM: MAGNESIUM: 1.7 mg/dL — ABNORMAL LOW (ref 1.8–2.6)

## 2022-02-26 LAB — CRYPTOCOCCUS ANTIGEN SCREEN W/ TITER REFLEX, CSF: CRYPTOCOCCUS ANTIGEN SCREEN, CSF: NEGATIVE

## 2022-02-26 LAB — METHYLMALONIC ACID (MMA), QUANTITATIVE, PLASMA: METHYLMALONIC ACID: 172 nmol/L (ref 87–318)

## 2022-02-26 LAB — PHOSPHORUS: PHOSPHORUS: 2.1 mg/dL — ABNORMAL LOW (ref 2.4–4.7)

## 2022-02-26 LAB — POC BLOOD GLUCOSE (RESULTS)
GLUCOSE, POC: 181 mg/dl — ABNORMAL HIGH (ref 70–105)
GLUCOSE, POC: 148 mg/dl — ABNORMAL HIGH (ref 70–105)
GLUCOSE, POC: 164 mg/dl — ABNORMAL HIGH (ref 70–105)
GLUCOSE, POC: 134 mg/dl — ABNORMAL HIGH (ref 70–105)

## 2022-02-26 LAB — VDRL, SPINAL FLUID: VDRL, CSF: NONREACTIVE

## 2022-02-26 NOTE — Progress Notes (Signed)
[facility]  Neurology Progress Note      Gordon Hobbs, Gordon Hobbs, 32 y.o. male  Date of Admission:  02/23/2022  Date of service: 02/26/2022  Date of Birth:  04/10/1990      Chief Complaint: Optic Neuritis   Pt's condition today: stable    Subjective: NAEO, today day 2 of steroids. Patient's exam is stable, no new concerns, tolerating medication w/o side effects.     Vital Signs:  Temp (24hrs) Max:37.2 C (99 F)      Systolic (123XX123), [id number] , Min:99 , [id number]     Diastolic (123XX123), [id number], Min:59, Max:81    Temp  Avg: 36.7 C (98 F)  Min: 36.4 C (97.5 F)  Max: 37.2 C (99 F)  MAP (Non-Invasive)  Avg: 80 mmHG  Min: 70 mmHG  Max: 92 mmHG  Pulse  Avg: 88.5  Min: 74  Max: 104  Resp  Avg: 18  Min: 18  Max: 18  SpO2  Avg: 96.8 %  Min: 96 %  Max: 98 %       Today's Physical Exam:  General: alert  Mental status: Alert and oriented x 3  Memory: Registration, Recall, and Following of commands is normal  Attention: Attention and Concentration are normal  Knowledge: Good  Language and Speech: Normal and Normal  Cranial nerves: Cranial nerves 2-12 are normal, except rt CN2 blurred/decreased acuity  Muscle tone: WNL  Motor strength:  Motor strength is normal throughout.  Sensory: Sensory exam in the upper and lower extremities is normal  Gait: Normal  Coordination: Coordination is normal without tremor  Reflexes: Reflexes are 2/2 throughout      Current Medications:  acetaminophen (TYLENOL) tablet, 650 mg, Oral, Q4H PRN  enoxaparin PF (LOVENOX) 40 mg/0.4 mL SubQ injection, 40 mg, Subcutaneous, Q24H  lidocaine-menthol (LIDOPATCH) 3.6%-1.25% patch, 1 Patch, Transdermal, Daily  methylPREDNISolone (SOLU-medrol) 1,000 mg in NS 100 mL IVPB, 1,000 mg, Intravenous, Daily  ondansetron (ZOFRAN) 2 mg/mL injection, 4 mg, Intravenous, Q6H PRN  pantoprazole (PROTONIX) delayed release tablet, 40 mg, Oral, Daily before Breakfast  SSIP insulin lispro (HumaLOG) 100 units/mL injection, 0-12 Units, Subcutaneous, 4x/day PRN        I/O:  I/O  last 24 hours:    Intake/Output Summary (Last 24 hours) at 02/26/2022 0846  Last data filed at 02/25/2022 1842  Gross per 24 hour   Intake 476 ml   Output --   Net 476 ml     I/O current shift:  No intake/output data recorded.    Labs  Please indicate ordered or reviewed)  Reviewed: I have reviewed all lab results.    Review of reports and notes reveal:     MRI BRAIN W/WO CONTRAST    Result Date: 02/23/2022  Impression 1.  Findings suggestive of right optic neuritis. 2. Normal brain MRI.    MRI ORBITS W/WO CONTRAST    Result Date: 02/23/2022  Impression 1.  Findings suggestive of right optic neuritis. 2. Normal brain MRI.    MRV INTRACRANIAL W/WO CONTRAST    Result Date: 02/23/2022  Impression  As above.       Independent Interpretation of images or specimens:      Patient/ Family Discussion: Discussed with patient and his wife the plan of care, all parties are agreeable.     ASSESSMENT & PLAN  Active Hospital Problems    Diagnosis    Primary Problem: Optic neuritis    Gordon Hobbs is a 32 y.o., White male with  no significant PMH who presents with right eye vision loss. Patient reports that about 12 days ago he woke up and noticed that there was a halo of vision loss in the middle of his right eye.       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  - CSF ACE AB, Lyme AB, bacterial culture, cryp antigen, cell count with diff, glucose, protein, cytopathology, oligoclonal bands (multiple sclerosis panel), VDRL, CMV, VZV, HSV, NMO, MOG.  - Preliminary Results: Glucose 58, Protein 24, Nucleated cells 1, RBC 1  - Start High Dose steroids 3  days, First dose 2/24   - neuro and vital checks      Ulyses Southward, MD, MPH  Neurology, PGY-3  [facility]  PAGER: SPOK    I saw and examined the patient.  I reviewed the resident's note.  I agree with the findings and plan of care as documented in the resident's note.  Any exceptions/additions are edited/noted.    Lianne Moris, MD, PhD 02/26/2022, 12:50

## 2022-02-26 NOTE — Progress Notes (Signed)
[facility]  Neurology Progress Note      Gordon Hobbs, Gordon Hobbs, 32 y.o. male  Date of Admission:  02/23/2022  Date of service: 02/26/2022  Date of Birth:  1990/07/12      Chief Complaint: Optic Neuritis   Pt's condition today: stable    Subjective: NAEO, today is day 3 of steroids. Reports he was unable to sleep after day 1 of steroids but was able to sleep earlier last night. Reports no pain with most EOMs, some pain with rolling eyes. Vision has improved; has central vision disturbance described as what happens after looking at flash from a camera.  Has not require lispro since yesterday morning.    Vital Signs:  Temp (24hrs) Max:36.7 C (A999333 F)      Systolic (123XX123), 0000000 , Min:103 , [id number]     Diastolic (123XX123), 0000000, Min:62, Max:83    Temp  Avg: 36.6 C (97.9 F)  Min: 36.5 C (97.7 F)  Max: 36.7 C (98.1 F)  MAP (Non-Invasive)  Avg: 83 mmHG  Min: 74 mmHG  Max: 98 mmHG  Pulse  Avg: 87  Min: 79  Max: 100  Resp  Avg: 18  Min: 18  Max: 18  SpO2  Avg: 96.6 %  Min: 95 %  Max: 98 %       Today's Physical Exam:  General: alert  Mental status: Alert and oriented x 3  Memory: Registration, Recall, and Following of commands is normal  Attention: Attention and Concentration are normal  Knowledge: Good  Language and Speech: Normal and Normal  Cranial nerves: Cranial nerves 2-12 are normal, except rt CN2 blurred/decreased acuity - improved  Muscle tone: WNL  Motor strength:  Motor strength is normal throughout.  Sensory: Sensory exam in the upper and lower extremities is normal  Gait: Normal  Coordination: Coordination is normal without tremor  Reflexes: Reflexes are 2/2 throughout      Current Medications:  acetaminophen (TYLENOL) tablet, 650 mg, Oral, Q4H PRN  enoxaparin PF (LOVENOX) 40 mg/0.4 mL SubQ injection, 40 mg, Subcutaneous, Q24H  lidocaine-menthol (LIDOPATCH) 3.6%-1.25% patch, 1 Patch, Transdermal, Daily  methylPREDNISolone (SOLU-medrol) 1,000 mg in NS 100 mL IVPB, 1,000 mg, Intravenous,  Daily  ondansetron (ZOFRAN) 2 mg/mL injection, 4 mg, Intravenous, Q6H PRN  pantoprazole (PROTONIX) delayed release tablet, 40 mg, Oral, Daily before Breakfast  SSIP insulin lispro (HumaLOG) 100 units/mL injection, 0-12 Units, Subcutaneous, 4x/day PRN        I/O:  I/O last 24 hours:  No intake or output data in the 24 hours ending 02/26/22 2104    I/O current shift:  No intake/output data recorded.    Labs  Please indicate ordered or reviewed)  Reviewed: I have reviewed all lab results.    Review of reports and notes reveal:   MRI BRAIN W/WO CONTRAST  Result Date: 02/23/2022  Impression 1.  Findings suggestive of right optic neuritis. 2. Normal brain MRI.    MRI ORBITS W/WO CONTRAST  Result Date: 02/23/2022  Impression 1.  Findings suggestive of right optic neuritis. 2. Normal brain MRI.    MRV INTRACRANIAL W/WO CONTRAST  Result Date: 02/23/2022  Impression  As above.       Independent Interpretation of images or specimens:      Patient/ Family Discussion: Discussed with patient and his wife the plan of care, all parties are agreeable.     ASSESSMENT & PLAN  Active Hospital Problems    Diagnosis    Primary Problem: Optic neuritis  Gordon Hobbs is a 32 y.o., White male with no significant PMH who presents with right eye vision loss. Patient reports that about 12 days ago he woke up and noticed that there was a halo of vision loss in the middle of his right eye.       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   Workup:  - MRI brain w/wo contrast - rt optic nerve enhancement   Plan:  - Consult Ophthalmology:   - prednisone taper  - Neuro Ophthalmology follow up  -LP for CSF evaluation on 2/23  - CSF ACE AB, Lyme AB, cytopathology, oligoclonal bands (multiple sclerosis panel),  MOG, AQP4, myelin pending  - Preliminary Results: Glucose 58, Protein 24, Nucleated cells 1, RBC 1, LDH <30  - NMO, VDRL,  HSV, cryptococcus, negative  - fungal and gram stain NGTD  - Serum ACE, Bartonella,  lysozyme pending   - serum lyme, ANA, syphilis negative  - Start High Dose steroids x3 days, First dose 2/24    - followed by PO prednisone '1mg'$ /kg/day for 11 days, then 4 day taper  - neuro and vital checks  - MRI c and t-spine ordered      Gaye Alken, MD  Neurology PGY-1  [facility]      I saw and examined the patient.  I reviewed the resident's note.  I agree with the findings and plan of care as documented in the resident's note.  Any exceptions/additions are edited/noted. 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 Other studies pending. Will get outpatient spine imaging. 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    Dorette Grate, MD

## 2022-02-26 NOTE — Consults (Signed)
[facility] Hobbs [facility]  OPHTHALMOLOGY Consult Follow Up    Gordon Hobbs, Gordon Hobbs, 32 y.o. male  Date of Birth:  06-27-1990  Date of Service:  02/26/2022    F/U evalutation of:  right optic neuritis    Subjective:  Seen this AM, day 2 of steroids. Reports improved pain with right eye EOM and reduced sensation of eye pressure.     Data:    Mood and Affect Normal:  Yes  Mental Status - Oriented to person, place, and time:  Yes    Visual Acuity:  sc  cc  ph  near    OD  +     20/40-2    OS  +     20/20       Intraocular Pressure: by Palpation  OD:  22     OS:  21      Ocular Motility and Alignment:    Full ductions and versions    Visual fields by confrontation:    Normal OU     OD OS   Adnexa WNL WNL   Cornea WNL WNL   Anterior Chamber WNL WNL   Iris/pupils   WNL WNL   APD Tr APD No   Lens WNL WNL     Pupil Dilation:  not done    Labs/Images:      Results for orders placed or performed during the Hobbs encounter of 02/23/22 (from the past 24 hour(s))   BASIC METABOLIC PANEL, NON-FASTING   Result Value Ref Range    SODIUM 138 136 - 145 mmol/L    POTASSIUM 3.6 3.5 - 5.1 mmol/L    CHLORIDE 107 96 - 111 mmol/L    CO2 TOTAL 21 (L) 22 - 30 mmol/L    ANION GAP 10 4 - 13 mmol/L    CALCIUM 9.6 8.6 - 10.2 mg/dL    GLUCOSE 142 (H) 65 - 125 mg/dL    BUN 14 8 - 25 mg/dL    CREATININE 0.77 0.75 - 1.35 mg/dL    BUN/CREA RATIO 18 6 - 22    ESTIMATED GFR - MALE >90 >=60 mL/min/BSA   CBC/DIFF    Narrative    The following orders were created for panel order CBC/DIFF.  Procedure                               Abnormality         Status                     ---------                               -----------         ------                     CBC WITH DIFF[[id number]]                Abnormal            Final result                 Please view results for these tests on the individual orders.   MAGNESIUM   Result Value Ref Range    MAGNESIUM 1.7 (L) 1.8 - 2.6 mg/dL   PHOSPHORUS   Result Value Ref Range    PHOSPHORUS 2.1 (L) 2.4 -  4.7 mg/dL    CBC WITH DIFF   Result Value Ref Range    WBC 23.6 (H) 3.7 - 11.0 x10^3/uL    RBC 4.77 4.50 - 6.10 x10^6/uL    HGB 14.7 13.4 - 17.5 g/dL    HCT 41.7 38.9 - 52.0 %    MCV 87.4 78.0 - 100.0 fL    MCH 30.8 26.0 - 32.0 pg    MCHC 35.3 31.0 - 35.5 g/dL    RDW-CV 12.2 11.5 - 15.5 %    PLATELETS 264 150 - 400 x10^3/uL    MPV 10.6 8.7 - 12.5 fL    NEUTROPHIL % 89.3 %    LYMPHOCYTE % 6.9 %    MONOCYTE % 2.8 %    EOSINOPHIL % 0.0 %    BASOPHIL % 0.2 %    NEUTROPHIL # 21.04 (H) 1.50 - 7.70 x10^3/uL    LYMPHOCYTE # 1.63 1.00 - 4.80 x10^3/uL    MONOCYTE # 0.66 0.20 - 1.10 x10^3/uL    EOSINOPHIL # <0.10 <=0.50 x10^3/uL    BASOPHIL # <0.10 <=0.20 x10^3/uL    IMMATURE GRANULOCYTE % 0.8 0.0 - 1.0 %    IMMATURE GRANULOCYTE # 0.18 (H) <0.10 x10^3/uL   POC BLOOD GLUCOSE (RESULTS)   Result Value Ref Range    GLUCOSE, POC 106 (H) 70 - 105 mg/dl   POC BLOOD GLUCOSE (RESULTS)   Result Value Ref Range    GLUCOSE, POC 220 (H) 70 - 105 mg/dl   POC BLOOD GLUCOSE (RESULTS)   Result Value Ref Range    GLUCOSE, POC 133 (H) 70 - 105 mg/dl   POC BLOOD GLUCOSE (RESULTS)   Result Value Ref Range    GLUCOSE, POC 181 (H) 70 - 105 mg/dl       MRV INTRACRANIAL W/WO CONTRAST   Final Result    As above.      MRI ORBITS W/WO CONTRAST   Final Result   1.  Findings suggestive of right optic neuritis.   2. Normal brain MRI.      MRI BRAIN W/WO CONTRAST   Final Result   1.  Findings suggestive of right optic neuritis.   2. Normal brain MRI.          Plan(s)/Recommendation(s):    32 y.o. M admitted for suspected optic neuritis right eye. Symptoms 12 days of visual disturbance in the right eye prior to presentation. MRI orbits: suggesting right optic neuritis.     Recommendations:  -s/p LP 02/24/22, no documented opening pressure  -if infectious workup remains negative, recommend treatment of optic neuritis per ONTT trial: first dose 2/24   IV methylpred 1g/day x 3 days followed by PO pred 16m/kg/day for 11 days followed by taper over 4 days.   -will arrange  follow up w/  Neuro Ophthalmology on discharge, please page when discharging for scheduling              - unremarkable CBC, BMP, ESR, CRP, LFT, PT/PTT, vitamin B12, MMA, ANA, FTA-ABS, RPR: non-reactive  - pending ACE, Lysozyme, NMO, Bartonella AB, Lyme AB, Quantiferon gold,               - pending CSF ACE AB, Lyme AB, bacterial culture, cryp antigen, cell count with diff, glucose, protein, cytopathology, oligoclonal bands (multiple sclerosis panel), VDRL, VZV, HSV, NMO.       DJason Fila MD  Ophthalmology Resident   2/25/202409:38      I saw and examined the patient.  I reviewed  the resident's note.  I agree with the findings and plan of care as documented in the resident's note.  Any exceptions/additions are edited/noted.    Gordon Curling, MD

## 2022-02-27 ENCOUNTER — Other Ambulatory Visit: Payer: Self-pay

## 2022-02-27 LAB — BASIC METABOLIC PANEL
ANION GAP: 8 mmol/L (ref 4–13)
BUN/CREA RATIO: 21 (ref 6–22)
BUN: 16 mg/dL (ref 8–25)
CALCIUM: 8.9 mg/dL (ref 8.6–10.2)
CHLORIDE: 105 mmol/L (ref 96–111)
CO2 TOTAL: 23 mmol/L (ref 22–30)
CREATININE: 0.76 mg/dL (ref 0.75–1.35)
ESTIMATED GFR - MALE: >90 mL/min/BSA (ref >=60)
GLUCOSE: 138 mg/dL — ABNORMAL HIGH (ref 65–125)
POTASSIUM: 3.5 mmol/L (ref 3.5–5.1)
SODIUM: 136 mmol/L (ref 136–145)

## 2022-02-27 LAB — QUANTIFERON TB NIL: QFT NIL: 0.0644 IU/mL

## 2022-02-27 LAB — CBC WITH DIFF
BASOPHIL #: <0.1 10*3/uL (ref <=0.20)
BASOPHIL %: 0.2 %
EOSINOPHIL #: <0.1 10*3/uL (ref <=0.50)
EOSINOPHIL %: 0 %
HCT: 39 % (ref 38.9–52.0)
HGB: 13.8 g/dL (ref 13.4–17.5)
IMMATURE GRANULOCYTE #: 0.26 10*3/uL — ABNORMAL HIGH (ref <0.10)
IMMATURE GRANULOCYTE %: 1 % (ref 0.0–1.0)
LYMPHOCYTE #: 2.19 10*3/uL (ref 1.00–4.80)
LYMPHOCYTE %: 8.6 %
MCH: 30.3 pg (ref 26.0–32.0)
MCHC: 35.4 g/dL (ref 31.0–35.5)
MCV: 85.5 fL (ref 78.0–100.0)
MONOCYTE #: 1 10*3/uL (ref 0.20–1.10)
MONOCYTE %: 3.9 %
MPV: 10.5 fL (ref 8.7–12.5)
NEUTROPHIL #: 21.83 10*3/uL — ABNORMAL HIGH (ref 1.50–7.70)
NEUTROPHIL %: 86.3 %
PLATELETS: 250 10*3/uL (ref 150–400)
RBC: 4.56 10*6/uL (ref 4.50–6.10)
RDW-CV: 12.7 % (ref 11.5–15.5)
WBC: 25.3 10*3/uL — ABNORMAL HIGH (ref 3.7–11.0)

## 2022-02-27 LAB — BARTONELLA SPECIES AB(IGG,IGM) W/REFL TITER
B. HENSELAE AB(IGG),SCREEN: NEGATIVE
B. HENSELAE AB(IGM),SCREEN: NEGATIVE
B. QUINTANA AB(IGG),SCREEN: NEGATIVE
B. QUINTANA AB(IGM),SCREEN: NEGATIVE

## 2022-02-27 LAB — QUANTIFERON
QFT MITOGEN: >10 IU/mL
QFT NIL: 0.0644 IU/mL
QFT QUALITATIVE: NEGATIVE
QFT TB1: 0.25 IU/mL
QFT TB2: 0.23 IU/mL

## 2022-02-27 LAB — POC BLOOD GLUCOSE (RESULTS)
GLUCOSE, POC: 145 mg/dl — ABNORMAL HIGH (ref 70–105)
GLUCOSE, POC: 114 mg/dl — ABNORMAL HIGH (ref 70–105)

## 2022-02-27 LAB — MAGNESIUM: MAGNESIUM: 1.8 mg/dL (ref 1.8–2.6)

## 2022-02-27 LAB — LYSOZYME (MURAMIDASE), PLASMA: LYSOZYME (MURAMIDASE): 5.3 ug/mL (ref 5.0–11.0)

## 2022-02-27 LAB — PHOSPHORUS: PHOSPHORUS: 2.9 mg/dL (ref 2.4–4.7)

## 2022-02-27 LAB — QUANTIFERON TB MITOGEN: QFT MITOGEN: >10 IU/mL

## 2022-02-27 LAB — QUANTIFERON TB2: QFT TB2: 0.23 IU/mL

## 2022-02-27 LAB — QUANTIFERON TB1: QFT TB1: 0.25 IU/mL

## 2022-02-27 MED ORDER — PREDNISONE 10 MG TABLET
ORAL_TABLET | ORAL | 0 refills | Status: AC
Start: 2022-02-27 — End: 2022-03-14
  Filled 2022-02-27: qty 90, 15d supply, fill #0

## 2022-02-27 MED ORDER — PANTOPRAZOLE 40 MG TABLET,DELAYED RELEASE
40.0000 mg | DELAYED_RELEASE_TABLET | Freq: Every day | ORAL | 0 refills | Status: AC
Start: 2022-02-27 — End: 2022-03-13
  Filled 2022-02-27: qty 14, 14d supply, fill #0

## 2022-02-27 NOTE — Care Plan (Signed)
02/27/22 1106   Therapist Pager   OT Assigned/ Pager # Arienna Benegas [phone]   Rehab Session   Document Type rehab contact note   Total OT Minutes: 7     Mr. Capponi presents with no functional deficits on this date. Pt's R eye vision has improved and OT educated him on visual compensation with L eye for improved safety and ADL performance. Pt is completing ADLs and functional mobility with no difficulty. Pt has no barriers to home set up (family/friend support, ramp entry, single level). Pt is safe to d/c home with assist as needed and no DME recommendations. OT will be signing off. Thank you for this consult.     Grier Mitts, MOT, OTR/L  Pager [phone]

## 2022-02-27 NOTE — Nurses Notes (Signed)
Patient being d/c home. AVS printed and provided to patient, all questions answered at this time. PIVs removed, belongings sent with patient.

## 2022-02-27 NOTE — Discharge Summary (Signed)
[facility]  DISCHARGE SUMMARY    PATIENT NAME:  Gordon Hobbs, Gordon Hobbs  MRN:  [id number]  DOB:  08/05/1990    ENCOUNTER DATE:  02/23/2022  INPATIENT ADMISSION DATE: 02/24/2022  DISCHARGE DATE:  02/27/2022    ATTENDING PHYSICIAN: No att. providers found  SERVICE: NEUROLOGY 1  PRIMARY CARE PHYSICIAN: No Pcp       No lay caregiver identified.    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     There are no active non-hospital problems to display for this patient.          Current Discharge Medication List        START taking these medications.        Details   pantoprazole 40 mg Tablet, Delayed Release (E.C.)  Commonly known as: PROTONIX   Take 1 Tablet (40 mg total) by mouth Once a day for 14 days  Qty: 14 Tablet  Refills: 0     predniSONE 10 mg Tablet  Commonly known as: DELTASONE  Start taking on: February 27, 2022   Take 7 Tablets by mouth Once a day for 11 days, THEN 6 Tablets Once a day for 1 day, THEN 4 Tablets Once a day for 1 day, THEN 2 Tablets Once a day for 1 day, THEN 1 Tablet Once a day for 1 day.  Qty: 90 Tablet  Refills: 0            Discharge med list refreshed?  YES   NEUROLOGY RISK FACTORS:  -None of the following conditions apply    No Known Allergies  HOSPITAL PROCEDURE(S):   Orders Placed This Encounter   Procedures    BEDSIDE  LUMBAR PUNCTURE       REASON FOR HOSPITALIZATION AND HOSPITAL COURSE   BRIEF HPI:  Vestel Hobbs is a 32 y.o., White male with no significant PMH who presents with right eye vision loss. Patient reports that about 12 days ago he woke up and noticed that there was a halo of vision loss in the middle of his right eye. He reports that shapes inside it look distorted and that it is darker than his peripheral vision. He also noticed that his peripheral vision is intact. He reports that he has eye pain  when he moves his eye, especially upwards and to the right.      BRIEF HOSPITAL NARRATIVE:   While in the hospital the patient's acute and chronic conditions were monitored and managed on the General Neurology Service. The patient underwent imaging and lab studies to elicit a diagnosis/reason for current condition and guide treatment. The patient's MRI Brain and Orbits as below showed right optic nerve enchantment, consistent with a diagnosis of optic neuritis. His MRI brain did not show any concerning demyelinating lesions. Due to this finding, he additional underwent Lumbar Puncture with initial labs showing Glucose 58, Protein 24, Nucleated cells 1, RBC 1. Given there did not appear to be any infection, it was deemed safe to initiate high dose steroids of which he continued on for 3 total days. He was seen by ophthalmology who recommended same as well as a 15 day course of lower dose steroids with taper. The patient will discharge home on prednisone 70 mg daily for eleven days and a gradual taper over the following  four days until off. He will have close follow-up with ophthalmology and will see Neurology as well for follow up of all pending labwork and evaluate his symptom progress. While the patient was in the hospital and after day three of steroids, he was noting improvement in the vision of his right eye, which shows good response to the steroids. Also reported pain with EOMs mostly resolved. The patient ambulates without assistance at baseline and is independent with her ADLs and as such did not require additional assessment by PT/OT for home safety, but it was decided to have the patient see OT for his decreased vision of which he was agreeable. They determined he was stable to return home without assistance. The discharge plan of care was discussed with patient and responsible party and the patient was deemed medically stable for discharge to home with all appropriate medications and follow-ups in place.            PERTINENT IMAGING/STUDIES:    MRI BRAIN W/WO CONTRAST  Result Date: 02/23/2022  Impression 1.  Findings suggestive of right optic neuritis. 2.Normal brain MRI.    MRI ORBITS W/WO CONTRAST  Result Date: 02/23/2022  Impression 1.  Findings suggestive of right optic neuritis. 2.Normal brain MRI.    MRV INTRACRANIAL W/WO CONTRAST  Result Date: 02/23/2022  Impression  As above.        EXAM ON DISCHARGE:  General: alert  Mental status: Alert and oriented x 3  Memory: Registration, Recall, and Following of commands is normal  Attention: Attention and Concentration are normal  Knowledge: Good  Language and Speech: Normal and Normal  Cranial nerves: Pupils reactive bilaterally, maybe very subtle APD on the right, face symmetric  Muscle tone: WNL  Motor strength:  Motor strength is normal throughout.  Sensory: Sensory exam in the upper and lower extremities is normal  Gait: Normal  Coordination: Coordination is normal without tremor  Reflexes: Reflexes are 2/2 throughout      TRANSITION/POST DISCHARGE CARE/PENDING TESTS/REFERRALS:     - CSF Studies: NMO, MOG, Infectious/Inflammatory   - Neurology follow up in 6 months with repeat imaging before that appointment  - Neuro Ophthalmology follow up ordered    CONDITION ON DISCHARGE:  A. Ambulation: Full ambulation  B. Self-care Ability: Complete  C. Cognitive Status Alert and Oriented x 3  D. Code status at discharge: Full Code      LINES/DRAINS/WOUNDS AT DISCHARGE:   Patient Lines/Drains/Airways Status       Active Line / Dialysis Catheter / Dialysis Graft / Drain / Airway / Wound       Name Placement date Placement time Site Days    Peripheral IV Left Median Cubital  (antecubital fossa) 02/23/22  1357  -- 3                    DISCHARGE DISPOSITION:  Home discharge  DISCHARGE INSTRUCTIONS:       MRI SPINE THORACIC W/WO CONTRAST           Reason for exam: evaluation for demyelinating lesions - MS work up    Does the patient have a pacemaker or defibrillator? No    What is  the patient's weight in lbs? 69.9 kg (154 lb) (02/26/2022)    Is the patient claustrophobic? No    Does the patient require anesthesia services for this test? No    What is the patient's height? 1.753 m ('5\' 9"'$ ) (02/26/2022)      MRI SPINE CERVICAL  W/WO CONTRAST           Reason for exam: evaluation for demyelinating lesions - MS work up    Does the patient have a pacemaker or defibrillator? No    What is the patient's weight in lbs? 69.9 kg (154 lb) (02/26/2022)    Is the patient claustrophobic? No    Does the patient require anesthesia services for this test? No    What is the patient's height? 1.753 m ('5\' 9"'$ ) (02/26/2022)      MRI BRAIN W/WO CONTRAST           Reason for exam: Multiple Sclerosis evaluation follow up    Does the patient have a pacemaker or defibrillator? No    What is the patient's weight in lbs? 69.9 kg (154 lb) (02/26/2022)    Is the patient claustrophobic? No    Does the patient require anesthesia services for this test? No    What is the patient's height? 1.753 m ('5\' 9"'$ ) (02/26/2022)      MRI SPINE CERVICAL W/WO CONTRAST           Reason for exam: MS evaluation follow up for demylinating lesions    Does the patient have a pacemaker or defibrillator? No    What is the patient's weight in lbs? 69.9 kg (154 lb) (02/26/2022)    Is the patient claustrophobic? No    Does the patient require anesthesia services for this test? No    What is the patient's height? 1.753 m ('5\' 9"'$ ) (02/26/2022)      MRI SPINE THORACIC W/WO CONTRAST           Reason for exam: Multiple Sclerosis evaluation follow up for demyelinating lesions    Does the patient have a pacemaker or defibrillator? No    What is the patient's weight in lbs? 69.9 kg (154 lb) (02/26/2022)    Is the patient claustrophobic? No    Does the patient require anesthesia services for this test? No    What is the patient's height? 1.753 m ('5\' 9"'$ ) (02/26/2022)      DISCHARGE INSTRUCTION - IMPORTANT INFORMATION    - Take prednisone 70 mg for 11 days then taper  according to instructions. We have prescribed Protonix to take with the steroids. You can also find it over the counter.  - MRI of cervical and thoracic spine ordered for outpatient. They will call you to schedule them.  - MRI of brain and spine also ordered for before your 6 month follow up. Please get these before your Neurology appointment    GENERAL INFORMATION: Contact the neurology office for questions/concerns Monday-Friday 7 a.m. - 5 p.m. at [phone].     FOLLOW-UP: [place], [place]     Follow-up in: OTHER    Other, Please Specify: 6 months    Reason for visit: [place] Discharge Destination: Home    Diagnosis Optic neuritis [id number]    Provider: Dr. Derrek Monaco or First Available Resident      FOLLOW-UP: [place], [place]     Follow-up in: 2 Weeks    Reason for visit: [place] Discharge Destination: Home    Diagnosis Encounter to establish care [id number]           Gaye Alken, MD    Copies sent to Care Team  Relationship Specialty Notifications Start End    Pcp, No PCP - General   02/23/22             Referring providers can utilize [email or web address] to access their referred Anton patient's information.              I saw and examined the patient.  I reviewed the resident's note.  I agree with the findings and plan of care as documented in the resident's note.  Any exceptions/additions are edited/noted. Please see additional comments on progress note from today.     Dorette Grate, MD

## 2022-02-27 NOTE — Transitional Care (Signed)
Per chart review, patient has no PCP and is eligible for a post discharge follow up appointment in [facility]. Message sent to Dr. Jerilynn Mages Ward and CM Marijean Heath, informing of eligibility and process to obtain appointment. Patient will be monitored for TCC order placed at discharge.    Patient is resident of [facility] and would be eligible for a virtual visit if unable to attend in person.     //  Dan Humphreys, Patient Navigator 02/27/2022, 12:26

## 2022-02-28 ENCOUNTER — Ambulatory Visit (INDEPENDENT_AMBULATORY_CARE_PROVIDER_SITE_OTHER): Payer: Self-pay | Admitting: Ophthalmology

## 2022-02-28 ENCOUNTER — Telehealth (HOSPITAL_COMMUNITY): Payer: Self-pay | Admitting: Clinical

## 2022-02-28 LAB — MYELIN BASIC PROTEIN CSF: MYELIN BASIC PROTEIN: <2 mcg/L (ref <=4.0)

## 2022-02-28 NOTE — Telephone Encounter (Signed)
Attempted to return call to patient to reschedule appointment. No answer received. Message left requesting return call. Jorja Loa, RN  02/28/2022, 13:03

## 2022-02-28 NOTE — Telephone Encounter (Signed)
Returned call to Lennette Bihari to reschedule appointment as requested. Jorja Loa, RN  02/28/2022, 13:53

## 2022-02-28 NOTE — Telephone Encounter (Signed)
Regarding: Lissa Merlin  ----- Message from Myrtha Mantis sent at 02/28/2022  1:19 PM EST -----  Lennette Bihari, Social worker at [facility] d/c clinic would like you to call him at ext [phone] (ask for Lennette Bihari) to reschedule this pt's appointment.    ----- Message from Fredderick Severance sent at 02/28/2022  8:01 AM EST -----  Pt called to cancel for today he had to work and needs to reschedule

## 2022-02-28 NOTE — Telephone Encounter (Addendum)
Transition of Care Contact Information  Discharge Date: 02/27/2022  Transition Facility Type--Hospital (Inpatient or Observation)  [facility]  Interactive Contact(s): Completed or attempted contact indicated by Date/Time  First Attempt Call: 02/28/2022  9:33 AM  Contact Method(s)-- Patient/Caregiver Telephone  Clinical Staff Name/Role who Cato Mulligan, LICSW  Transition Note:  Unable to Complete TCM due to:  Attempted to reach patient by phone for the purpose of completing his hospital discharge follow up call.     Left voicemail message requesting a return phone call to [phone].     Patient has not returned the call at this time.     Patient does not have "my chart" activated at the present time.     Will attempt phone contact again this afternoon.     Duane Boston, LICSW  [phone]      Transition of Care Contact Information  Discharge Date: 02/27/2022  Transition Facility Type--Hospital (Inpatient or Observation)  [facility]  Interactive Contact(s): Completed or attempted contact indicated by Date/Time  Completed Contact: 02/28/2022  1:34 PM  First Attempt Call: 02/28/2022  9:33 AM  Contact Method(s)-- Patient/Caregiver Telephone  Clinical Staff Name/Role who Cato Mulligan, LICSW  Transition Assessment  Discharge Summary obtained?--Yes  How are you recovering?--Improving  Discharge Meds obtained?--Yes  Discharge medication changes reviewed?--Yes  Full Medication Reconciliation Completed?--Yes  Medication understanding --knows new medication(s)--knows purpose of medication  Medication Concerns?--No  Have everything needed for recovery?--Yes  Care Coordination:   Patient has transition follow-up appointment date and time?--Yes  Primary Care Transition Visit planned?--No  Specialist Transition Visit planned?--No  Patient/caregiver plans to attend transition visit?--Yes  Interventions provided --reinforced discharge  instructions--follow-up appointment date/time reinforced--follow-up appointment made--med list reviewed with patient--patient expresses understanding of follow-up [place] or DME ordered at discharge?--No  Clinician/Team notified?--No    Transition Note:  Spoke with patient to complete hospital discharge follow up call.     Reviewed AVS, medication list, and discharge recommendations.  Patient reported that he received his prescription medications (Prednisone and Protonix).  Patient confirmed that he is taking both medications as directed.     Patient communicated that the vision in his right eye is improving.  Patient denied experiencing any acute symptoms at the time of his phone call.      Patient reported that he cancelled his appointment with Ophthalmology today due to his work schedule.  [place] [phone]) and rescheduled his Ophthalmology appointment for tomorrow 03/01/22 at 1:00 pm.  Patient is aware of the appointment date/time/location and plans to attend.       Scheduled TCC appointment for 03/08/22 at 3:45 pm.  Provided patient with directions to the clinic.  Patient verbalized that he plans to attend the appointment and has transportation.        Per patient's request, provided him with contact information [phone]) to get his Cervical Spine MRI and Thoracic Spine MRI scheduled (to be convenient with his work schedule).    All questions were addressed.  Patient denied having any other needs at the current time.     Provided patient with the call back number of [phone] for future non-emergent issues/concerns or questions.     Advised patient to please visit his nearest emergency department or call 911 for any emergent issues/concerns.     Duane Boston, LICSW  [phone]

## 2022-02-28 NOTE — Telephone Encounter (Signed)
Regarding: Gordon Hobbs  ----- Message from Fredderick Severance sent at 02/28/2022  8:01 AM EST -----  Pt called to cancel for today he had to work and needs to reschedule

## 2022-03-01 ENCOUNTER — Ambulatory Visit (HOSPITAL_COMMUNITY): Payer: Self-pay

## 2022-03-01 ENCOUNTER — Ambulatory Visit (INDEPENDENT_AMBULATORY_CARE_PROVIDER_SITE_OTHER): Payer: Self-pay | Admitting: Ophthalmology

## 2022-03-01 ENCOUNTER — Other Ambulatory Visit (INDEPENDENT_AMBULATORY_CARE_PROVIDER_SITE_OTHER): Payer: Self-pay | Admitting: Ophthalmology

## 2022-03-01 DIAGNOSIS — H469 Unspecified optic neuritis: Secondary | ICD-10-CM

## 2022-03-01 LAB — CSF CULTURE WITH GRAM STAIN
CSF CULTURE: NO GROWTH
GRAM STAIN: NONE SEEN

## 2022-03-01 LAB — ANGIOTENSIN CONVERTING ENZYME (ACE), SERUM: ANGIOTENSIN CONV ENZYME: 48.9 U/L (ref 9–67)

## 2022-03-02 LAB — CSF FOR MS PROFILE
ALBUMIN, CSF: 13.9 mg/dL (ref 8.0–42.0)
ALBUMIN, SERUM: 4.6 g/dL (ref 3.6–5.1)
IGG INDEX, CSF: 0.54 (ref <0.70)
IGG, CSF: 1.7 mg/dL (ref 0.8–7.7)
IMMUNOGLOBULIN G, SERUM: 1050 mg/dL (ref 600–1640)
SYNTHESIS RATE IGG, CSF: -2.7 mg/(24.h) (ref ?–9.9)

## 2022-03-03 LAB — MOG AB W/REFL TITER, CSF: MOG AB CBA, CSF: NEGATIVE

## 2022-03-07 LAB — AQP4 AB,W/RFL TITER,CSF: AQUAPORIN 4 AB, CBA, CSF: NEGATIVE

## 2022-03-08 ENCOUNTER — Ambulatory Visit (INDEPENDENT_AMBULATORY_CARE_PROVIDER_SITE_OTHER): Payer: Self-pay | Admitting: Family

## 2022-03-15 ENCOUNTER — Ambulatory Visit (INDEPENDENT_AMBULATORY_CARE_PROVIDER_SITE_OTHER): Payer: Self-pay | Admitting: Family

## 2022-03-15 ENCOUNTER — Other Ambulatory Visit: Payer: Self-pay

## 2022-03-15 ENCOUNTER — Ambulatory Visit (INDEPENDENT_AMBULATORY_CARE_PROVIDER_SITE_OTHER): Payer: Self-pay | Admitting: Rheumatology

## 2022-03-15 ENCOUNTER — Ambulatory Visit: Payer: Self-pay | Attending: Internal Medicine

## 2022-03-15 ENCOUNTER — Encounter (INDEPENDENT_AMBULATORY_CARE_PROVIDER_SITE_OTHER): Payer: Self-pay

## 2022-03-15 VITALS — BP 129/84 | HR 86 | Temp 98.2°F | Ht 69.0 in | Wt 163.8 lb

## 2022-03-15 DIAGNOSIS — F419 Anxiety disorder, unspecified: Secondary | ICD-10-CM | POA: Insufficient documentation

## 2022-03-15 DIAGNOSIS — Z0000 Encounter for general adult medical examination without abnormal findings: Secondary | ICD-10-CM

## 2022-03-15 DIAGNOSIS — Z23 Encounter for immunization: Secondary | ICD-10-CM | POA: Insufficient documentation

## 2022-03-15 DIAGNOSIS — H469 Unspecified optic neuritis: Secondary | ICD-10-CM | POA: Insufficient documentation

## 2022-03-15 LAB — HIV1/HIV2 SCREEN, COMBINED ANTIGEN AND ANTIBODY: HIV SCREEN, COMBINED ANTIGEN & ANTIBODY: NEGATIVE

## 2022-03-15 LAB — HEPATITIS C ANTIBODY SCREEN WITH REFLEX TO HCV PCR: HCV ANTIBODY QUALITATIVE: NEGATIVE

## 2022-03-15 MED ORDER — HYDROXYZINE HCL 25 MG TABLET
25.0000 mg | ORAL_TABLET | Freq: Three times a day (TID) | ORAL | 3 refills | Status: DC | PRN
Start: 2022-03-15 — End: 2022-07-21

## 2022-03-15 MED ORDER — SERTRALINE 25 MG TABLET
25.0000 mg | ORAL_TABLET | Freq: Every day | ORAL | 3 refills | Status: DC
Start: 2022-03-15 — End: 2022-04-12

## 2022-03-15 NOTE — Progress Notes (Signed)
INTERNAL MEDICINE   [facility]  Return Visit Progress Note     Name: Gordon Hobbs Date of Service: 03/15/2022   MRN:  [id number]  Age/DOB: 32 y.o., 1990-05-27 PCP:  Gilman Schmidt, DO  Reason for Visit: New Patient     Subjective:      Gordon Hobbs is a 32 y.o. male with no significant past medical history who presents to the clinic to establish care. Patient was recently discharged from the hospital last month for right eye optic neuritis. He has completed a steroid taper and reports his vision is improved from discharge but is still with some distorted vision. He was unable to make it to his follow-up ophthalmology appointment at the end of February. He is scheduled to have a repeat MRI and also has a neurology appointment at the end of the month. He is also complaining of anxiety that has been ongoing for the past few years. Per family in the room, his anxiety has gotten worse over the past month given hospital admission as well as birth of his son last month. 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 He reports insomnia as well as palpitations/panic attack-like symptoms that especially occur in the evening. Otherwise, patient has no other acute concerns to address today.     Medical History:      Past Medical History, Allergies, Surgical History, Family, and Social History were reviewed  on   03/15/2022 and updated as needed.     MEDICATIONS:  Outpatient Medications Marked as Taking for the 03/15/22 encounter (Office Visit) with Gilman Schmidt, DO   Medication Sig    hydrOXYzine HCL (ATARAX) 25 mg Oral Tablet Take 1 Tablet (25 mg total) by mouth Every 8 hours as needed for Anxiety    sertraline (ZOLOFT) 25 mg Oral Tablet Take 1 Tablet (25 mg total) by mouth Once a day       Review of Systems (Bold is Positive):      Constitutional - fevers, chills, weight loss, malaise, fatigue, sweats  HEENT - visual disturbance, eye redness, hearing loss, earaches, nasal  congestion, sore throat, dysphagia  Respiratory -  cough, sputum, hemoptysis, shortness of breath  Cardiovascular - chest pain, orthopnea, lower extremity edema, palpitations  Gastrointestinal -  nausea, vomiting, diarrhea, constipation, blood in stool, abdominal pain  Genitourinary - dysuria, polyuria, urgency, hematuria  Integument- rashes, lesions, pruritis  Musculoskeletal - myalgias, arthralgias  Neurological - headaches, lightheadedness, weakness  Behavioral/Psych - anxiety, depressed mood  Endocrine - polydipsia, temperature intolerance     Physical Exam:     Vitals:   height is 1.753 m ('5\' 9"'$ ) and weight is 74.3 kg (163 lb 12.8 oz). His thermal scan temperature is 36.8 C (98.2 F). His blood pressure is 129/84 and his pulse is 86. His oxygen saturation is 98%.      General:  Well-nourished, well-developed, in no apparent distress  Head:  Normocephalic, atraumatic  Eyes:  Extraocular muscles intact, sclareae non-icteric, conjunctivae clear  Neck:  Trachea midline  Cardiovascular:  Regular rate and rhythm, no murmurs, rubs, gallops  Respiratory:  Clear to auscultation bilaterally, no wheezes, rhonchi, crackles  Abdominal:  Bowel sounds normal; abdomen soft, non-tender to palpation  Extremities:  No edema  Skin:  Warm and dry  Neurological:  Awake, A&O x 3.   Psychiatric:  Normal mood & affect, thought process linear, speech content normal       Data Reviewed  and Interpretation:              Assessment:      Gordon Hobbs is a 32 y.o. male with no significant past medical history who presents to the clinic to establish care and for management of anxiety.       Plan:     Routine Health Maintenance   -Labs done inpatient stable  -Ordered HIV and hepatitis C labs today  -Patient refused COVID and influenza vaccines; Tdap vaccine given today  -Up to date on other vaccines  -See below    Generalized Anxiety Disorder  -Initiated patient on Zoloft '25mg'$  daily today  -Plan for phone visit in 4 weeks; if patient  tolerating Zoloft '25mg'$  well, will increase to '50mg'$  daily then  -Vistaril '25mg'$  PRN ordered to help with acute anxiety attacks    Right Eye Optic Neuritis  -Completed prednisone taper  -MRI and neurology appointment scheduled for the end of the month  -Missed ophthalmology appointment in February;  requested follow-up appointment today       Healthcare Maintenance:     Preventative Healthcare (Male):    Colonoscopy (50-75 average risk or age 37 or 46 years prior to 1st degree relative diagnosis) N/A   CT chest (>30 pack year smoking history; yearly starting at age 60-80, current smoker or quit within 15 years) N/A   Prostate Cancer Screening (50-69 after risk versus benefit discussion) N/A   AAA Screening (65-75, >100 cigarettes) N/A   Depression Screening (yearly PHQ-2) Negative    Lipids Lab Results   Component Value Date    CHOLESTEROL 172 02/24/2022    HDLCHOL 30 (L) 02/24/2022    LDLCHOL 112 (H) 02/24/2022    TRIG 168 (H) 02/24/2022     The ASCVD Risk score (Arnett DK, et al., 2019) failed to calculate for the following reasons:    The 2019 ASCVD risk score is only valid for ages 57 to 76   Hemoglobin A1c (every 3 years if BMI >25 and additional risk factor - ADA)  No results found for: "HA1C"   HIV screening (once) No results found for: "RHIVAB", "HIVCO" ordered today   HCV screening (once) No results found for: "HEPCAB", ordered today   Tetanus (every 10 years or every 5 if wound) Given today   Flu Shot (yearly) Patient refused   Pneumonia Vaccine (>65yo or CAD/DM/COPD/Liver Disease/Smoking every 5 years or once after 65) N/A   Zoster vaccination (> 2 yo) N/A       Follow Up:       Return to clinic in one month   Labs and Imaging Needing Follow Up:  HIV, hepatitis C labs  Goals for Next Visit:  Address anxiety on Zoloft    Patient had no questions regarding treatment plan, goals, risks or benefits and agrees to contact me or my clinic in the interim should any questions or problems arise.    Gilman Schmidt,  DO  Internal Medicine, PGY-2    I discussed the patient's care with the Resident prior to the patient leaving the clinic. Any significant discussion points are noted.    Sabino Gasser, MD 03/15/2022, 16:40

## 2022-03-24 LAB — FUNGUS CULTURE: FUNGAL CULTURE: NO GROWTH

## 2022-03-30 ENCOUNTER — Ambulatory Visit (HOSPITAL_COMMUNITY): Payer: Self-pay | Admitting: Radiology

## 2022-04-12 ENCOUNTER — Ambulatory Visit: Payer: Self-pay | Attending: Internal Medicine

## 2022-04-12 ENCOUNTER — Other Ambulatory Visit: Payer: Self-pay

## 2022-04-12 DIAGNOSIS — F411 Generalized anxiety disorder: Secondary | ICD-10-CM

## 2022-04-12 MED ORDER — SERTRALINE 50 MG TABLET
50.0000 mg | ORAL_TABLET | Freq: Every day | ORAL | 2 refills | Status: DC
Start: 2022-04-12 — End: 2022-07-21

## 2022-04-12 NOTE — Progress Notes (Signed)
INTERNAL MEDICINE, PHYSICIAN OFFICE CENTER  [address]  [place] [address]  Operated by [facility]  Telephone Visit    Name:  Gordon Hobbs MRN: [id number]   Date:  04/12/2022 Age:   32 y.o.     The patient/family initiated a request for telephone service.  Verbal consent for this service was obtained from the patient/family.    Last office visit in this department: 03/15/2022      Reason for call: follow up on GAD, Zoloft dose adjustment, and optic neuritis   Call notes: he reported improvement in his anxiety with Zoloft and vistaril. He did report being anxious prior to important business or family meeting but not as tolerable after starting Zoloft and vistaril would help on that too but it would cause mild drowsiness. Denied nausea, loss of appetite, abdominal upset, tremor, change in sleep, agitation. Regarding optic neuritis, he missed his MRI because of insurance issue but he managed to get charity aid for the next 6 months, I also gave him number for 2 agents who will help him for free to re-establish his insurance with [facility]. (V[employer] Vet on [employer] [address]).     Plan:  Increase Zoloft dose to 50 mg.  Continue vistaril PRN  Next appointment in 3 months to follow on anxiety, Zoloft side effect.       ICD-10-CM    1. GAD (generalized anxiety disorder)  F41.1           Total provider time spent with the patient on the phone: 15 minutes.    Serita Sheller, MD       I discussed the patient's care with the Resident prior to ending the phone visit. Any significant discussion points are noted.  My total time - 5 mins.    Freddie Apley. Mikey College, MD    Assistant Professor  [facility]  Department of Internal Medicine

## 2022-04-19 NOTE — Addendum Note (Signed)
Addended by: Marcene Corning on: 04/19/2022 12:29 PM     Modules accepted: Level of Service

## 2022-05-05 ENCOUNTER — Ambulatory Visit (INDEPENDENT_AMBULATORY_CARE_PROVIDER_SITE_OTHER): Payer: Self-pay | Admitting: Neurology

## 2022-07-21 ENCOUNTER — Other Ambulatory Visit (INDEPENDENT_AMBULATORY_CARE_PROVIDER_SITE_OTHER): Payer: Self-pay

## 2022-07-21 ENCOUNTER — Other Ambulatory Visit (HOSPITAL_COMMUNITY): Payer: Self-pay

## 2022-07-21 ENCOUNTER — Other Ambulatory Visit: Payer: Self-pay

## 2022-07-21 MED ORDER — HYDROXYZINE HCL 25 MG TABLET
25.0000 mg | ORAL_TABLET | Freq: Three times a day (TID) | ORAL | 3 refills | Status: DC | PRN
Start: 2022-07-21 — End: 2023-02-14

## 2022-07-21 MED ORDER — SERTRALINE 50 MG TABLET
50.0000 mg | ORAL_TABLET | Freq: Every day | ORAL | 2 refills | Status: DC
Start: 2022-07-21 — End: 2022-11-20

## 2022-07-21 MED ORDER — SERTRALINE 50 MG TABLET
50.0000 mg | ORAL_TABLET | Freq: Every day | ORAL | 2 refills | Status: DC
Start: 2022-07-21 — End: 2022-07-21
  Filled 2022-07-21: qty 30, 30d supply, fill #0

## 2022-07-21 MED ORDER — HYDROXYZINE HCL 25 MG TABLET
25.0000 mg | ORAL_TABLET | Freq: Three times a day (TID) | ORAL | 3 refills | Status: DC | PRN
Start: 2022-07-21 — End: 2022-07-21
  Filled 2022-07-21: qty 60, 20d supply, fill #0

## 2022-07-21 NOTE — Telephone Encounter (Signed)
Regarding: med refill  ----- Message from Malena Peer sent at 07/21/2022 11:07 AM EDT -----  Doctor Name:  Serita Sheller, MD       Date of last appointment: 04/12/22    Next scheduled visit: none    Medication Requested:     sertraline (ZOLOFT) 50 mg Oral Tablet    Preferred Pharmacy       [facility] [address] [place] - [address]    [address] [place] [address]    Phone: [phone] Fax: [phone]    Hours: Not open 24 hours          Notes for Nurse or Physician: Pt's Girlfriend stated there is another medication he needs but she was unsure of the name of it and said she would call back. Please advise. Thank you!

## 2022-08-23 ENCOUNTER — Encounter (HOSPITAL_COMMUNITY): Payer: Self-pay

## 2022-08-23 DIAGNOSIS — H469 Unspecified optic neuritis: Secondary | ICD-10-CM

## 2022-08-28 ENCOUNTER — Ambulatory Visit (HOSPITAL_BASED_OUTPATIENT_CLINIC_OR_DEPARTMENT_OTHER): Payer: Self-pay

## 2022-11-20 ENCOUNTER — Other Ambulatory Visit (INDEPENDENT_AMBULATORY_CARE_PROVIDER_SITE_OTHER): Payer: Self-pay

## 2022-11-20 NOTE — Telephone Encounter (Signed)
Regarding: Refill Request/Zoloft/pt completely out  ----- Message from Tammy B sent at 11/20/2022  1:48 PM EST -----  Copied From CRM #[id number].Hailey-boyfriend called to request a prescription refill. Pt is completely out.     Doctor Name:  Dr Jordan Likes   Date of last appointment: 04/12/22  Next scheduled visit: none  Medication Requested: sertraline (ZOLOFT) 50 mg Oral Tablet    Preferred Pharmacy     [facility] [address] [place] - [address]    [address] [place] [address]    Phone: [phone] Fax: [phone]    Hours: Not open 24 hours

## 2022-11-21 MED ORDER — SERTRALINE 50 MG TABLET
50.0000 mg | ORAL_TABLET | Freq: Every day | ORAL | 2 refills | Status: AC
Start: 2022-11-21 — End: ?

## 2022-11-21 NOTE — Telephone Encounter (Signed)
Duplicate refill

## 2023-02-14 ENCOUNTER — Other Ambulatory Visit (INDEPENDENT_AMBULATORY_CARE_PROVIDER_SITE_OTHER): Payer: Self-pay

## 2023-02-14 MED ORDER — HYDROXYZINE HCL 25 MG TABLET
25.0000 mg | ORAL_TABLET | Freq: Three times a day (TID) | ORAL | 0 refills | Status: AC | PRN
Start: 2023-02-14 — End: ?

## 2023-02-14 NOTE — Telephone Encounter (Signed)
Regarding: Refill Request  ----- Message from Hendricks Limes sent at 02/13/2023  4:17 PM EST -----  Copied From CRM [id number], ANAM, MD    Gordon Hobbs, pt girlfirend called to request a prescription refill.     Declined to make a follow up appt at this time.     hydrOXYzine HCL (ATARAX) 25 mg Oral Tablet 60 Tablet 3 07/21/2022 -   Sig - Route: Take 1 Tablet (25 mg total) by mouth Every 8 hours as needed for Anxiety - Oral     Preferred Pharmacy     [facility] [address] [place] - [address]    [address] [place] [address]    Phone: [phone] Fax: [phone]    Hours: Not open 24 hours
